# Patient Record
Sex: Female | Born: 1980 | Race: Asian | Hispanic: No | Marital: Married | Smoking: Never smoker
Health system: Southern US, Community
[De-identification: ages and names within clinical notes are randomized; demographics above are authoritative.]

## PROBLEM LIST (undated history)

## (undated) DIAGNOSIS — Z789 Other specified health status: Secondary | ICD-10-CM

## (undated) HISTORY — DX: Other specified health status: Z78.9

## (undated) HISTORY — PX: NO PAST SURGERIES: SHX2092

---

## 2014-08-09 LAB — OB RESULTS CONSOLE ABO/RH: RH Type: POSITIVE

## 2014-08-09 LAB — OB RESULTS CONSOLE GC/CHLAMYDIA
CHLAMYDIA, DNA PROBE: NEGATIVE
GC PROBE AMP, GENITAL: NEGATIVE

## 2014-08-09 LAB — OB RESULTS CONSOLE HEPATITIS B SURFACE ANTIGEN: Hepatitis B Surface Ag: NEGATIVE

## 2014-08-09 LAB — OB RESULTS CONSOLE HIV ANTIBODY (ROUTINE TESTING): HIV: NONREACTIVE

## 2014-08-09 LAB — OB RESULTS CONSOLE RUBELLA ANTIBODY, IGM: RUBELLA: IMMUNE

## 2014-08-09 LAB — OB RESULTS CONSOLE RPR: RPR: NONREACTIVE

## 2014-08-09 LAB — OB RESULTS CONSOLE ANTIBODY SCREEN: Antibody Screen: NEGATIVE

## 2014-12-12 ENCOUNTER — Inpatient Hospital Stay (HOSPITAL_COMMUNITY): Admission: AD | Admit: 2014-12-12 | Payer: Self-pay | Source: Ambulatory Visit | Admitting: Obstetrics and Gynecology

## 2015-03-11 LAB — OB RESULTS CONSOLE GBS: GBS: NEGATIVE

## 2015-03-20 ENCOUNTER — Inpatient Hospital Stay (HOSPITAL_COMMUNITY)
Admission: AD | Admit: 2015-03-20 | Discharge: 2015-03-20 | Disposition: A | Discharge status: Home / Self Care | Payer: BLUE CROSS/BLUE SHIELD | Source: Ambulatory Visit | Attending: Obstetrics and Gynecology | Admitting: Obstetrics and Gynecology

## 2015-03-20 ENCOUNTER — Encounter (HOSPITAL_COMMUNITY): Payer: Self-pay | Admitting: *Deleted

## 2015-03-20 ENCOUNTER — Encounter (HOSPITAL_COMMUNITY): Payer: Self-pay

## 2015-03-20 ENCOUNTER — Telehealth (HOSPITAL_COMMUNITY): Payer: Self-pay | Admitting: *Deleted

## 2015-03-20 NOTE — Discharge Instructions (Signed)
Third Trimester of Pregnancy °The third trimester is from week 29 through week 42, months 7 through 9. The third trimester is a time when the fetus is growing rapidly. At the end of the ninth month, the fetus is about 20 inches in length and weighs 6-10 pounds.  °BODY CHANGES °Your body goes through many changes during pregnancy. The changes vary from woman to woman.  °· Your weight will continue to increase. You can expect to gain 25-35 pounds (11-16 kg) by the end of the pregnancy. °· You may begin to get stretch marks on your hips, abdomen, and breasts. °· You may urinate more often because the fetus is moving lower into your pelvis and pressing on your bladder. °· You may develop or continue to have heartburn as a result of your pregnancy. °· You may develop constipation because certain hormones are causing the muscles that push waste through your intestines to slow down. °· You may develop hemorrhoids or swollen, bulging veins (varicose veins). °· You may have pelvic pain because of the weight gain and pregnancy hormones relaxing your joints between the bones in your pelvis. Backaches may result from overexertion of the muscles supporting your posture. °· You may have changes in your hair. These can include thickening of your hair, rapid growth, and changes in texture. Some women also have hair loss during or after pregnancy, or hair that feels dry or thin. Your hair will most likely return to normal after your baby is born. °· Your breasts will continue to grow and be tender. A yellow discharge may leak from your breasts called colostrum. °· Your belly button may stick out. °· You may feel short of breath because of your expanding uterus. °· You may notice the fetus "dropping," or moving lower in your abdomen. °· You may have a bloody mucus discharge. This usually occurs a few days to a week before labor begins. °· Your cervix becomes thin and soft (effaced) near your due date. °WHAT TO EXPECT AT YOUR PRENATAL  EXAMS  °You will have prenatal exams every 2 weeks until week 36. Then, you will have weekly prenatal exams. During a routine prenatal visit: °· You will be weighed to make sure you and the fetus are growing normally. °· Your blood pressure is taken. °· Your abdomen will be measured to track your baby's growth. °· The fetal heartbeat will be listened to. °· Any test results from the previous visit will be discussed. °· You may have a cervical check near your due date to see if you have effaced. °At around 36 weeks, your caregiver will check your cervix. At the same time, your caregiver will also perform a test on the secretions of the vaginal tissue. This test is to determine if a type of bacteria, Group B streptococcus, is present. Your caregiver will explain this further. °Your caregiver may ask you: °· What your birth plan is. °· How you are feeling. °· If you are feeling the baby move. °· If you have had any abnormal symptoms, such as leaking fluid, bleeding, severe headaches, or abdominal cramping. °· If you are using any tobacco products, including cigarettes, chewing tobacco, and electronic cigarettes. °· If you have any questions. °Other tests or screenings that may be performed during your third trimester include: °· Blood tests that check for low iron levels (anemia). °· Fetal testing to check the health, activity level, and growth of the fetus. Testing is done if you have certain medical conditions or if   there are problems during the pregnancy. °· HIV (human immunodeficiency virus) testing. If you are at high risk, you may be screened for HIV during your third trimester of pregnancy. °FALSE LABOR °You may feel small, irregular contractions that eventually go away. These are called Braxton Hicks contractions, or false labor. Contractions may last for hours, days, or even weeks before true labor sets in. If contractions come at regular intervals, intensify, or become painful, it is best to be seen by your  caregiver.  °SIGNS OF LABOR  °· Menstrual-like cramps. °· Contractions that are 5 minutes apart or less. °· Contractions that start on the top of the uterus and spread down to the lower abdomen and back. °· A sense of increased pelvic pressure or back pain. °· A watery or bloody mucus discharge that comes from the vagina. °If you have any of these signs before the 37th week of pregnancy, call your caregiver right away. You need to go to the hospital to get checked immediately. °HOME CARE INSTRUCTIONS  °· Avoid all smoking, herbs, alcohol, and unprescribed drugs. These chemicals affect the formation and growth of the baby. °· Do not use any tobacco products, including cigarettes, chewing tobacco, and electronic cigarettes. If you need help quitting, ask your health care provider. You may receive counseling support and other resources to help you quit. °· Follow your caregiver's instructions regarding medicine use. There are medicines that are either safe or unsafe to take during pregnancy. °· Exercise only as directed by your caregiver. Experiencing uterine cramps is a good sign to stop exercising. °· Continue to eat regular, healthy meals. °· Wear a good support bra for breast tenderness. °· Do not use hot tubs, steam rooms, or saunas. °· Wear your seat belt at all times when driving. °· Avoid raw meat, uncooked cheese, cat litter boxes, and soil used by cats. These carry germs that can cause birth defects in the baby. °· Take your prenatal vitamins. °· Take 1500-2000 mg of calcium daily starting at the 20th week of pregnancy until you deliver your baby. °· Try taking a stool softener (if your caregiver approves) if you develop constipation. Eat more high-fiber foods, such as fresh vegetables or fruit and whole grains. Drink plenty of fluids to keep your urine clear or pale yellow. °· Take warm sitz baths to soothe any pain or discomfort caused by hemorrhoids. Use hemorrhoid cream if your caregiver approves. °· If  you develop varicose veins, wear support hose. Elevate your feet for 15 minutes, 3-4 times a day. Limit salt in your diet. °· Avoid heavy lifting, wear low heal shoes, and practice good posture. °· Rest a lot with your legs elevated if you have leg cramps or low back pain. °· Visit your dentist if you have not gone during your pregnancy. Use a soft toothbrush to brush your teeth and be gentle when you floss. °· A sexual relationship may be continued unless your caregiver directs you otherwise. °· Do not travel far distances unless it is absolutely necessary and only with the approval of your caregiver. °· Take prenatal classes to understand, practice, and ask questions about the labor and delivery. °· Make a trial run to the hospital. °· Pack your hospital bag. °· Prepare the baby's nursery. °· Continue to go to all your prenatal visits as directed by your caregiver. °SEEK MEDICAL CARE IF: °· You are unsure if you are in labor or if your water has broken. °· You have dizziness. °· You have   mild pelvic cramps, pelvic pressure, or nagging pain in your abdominal area.  You have persistent nausea, vomiting, or diarrhea.  You have a bad smelling vaginal discharge.  You have pain with urination. SEEK IMMEDIATE MEDICAL CARE IF:   You have a fever.  You are leaking fluid from your vagina.  You have spotting or bleeding from your vagina.  You have severe abdominal cramping or pain.  You have rapid weight loss or gain.  You have shortness of breath with chest pain.  You notice sudden or extreme swelling of your face, hands, ankles, feet, or legs.  You have not felt your baby move in over an hour.  You have severe headaches that do not go away with medicine.  You have vision changes.   This information is not intended to replace advice given to you by your health care provider. Make sure you discuss any questions you have with your health care provider.   Document Released: 04/08/2001 Document  Revised: 05/05/2014 Document Reviewed: 06/15/2012 Elsevier Interactive Patient Education 2016 [place]. Fetal Movement Counts Patient Name: __________________________________________________ Patient Due Date: ____________________ Performing a fetal movement count is highly recommended in high-risk pregnancies, but it is good for every pregnant woman to do. Your health care provider may ask you to start counting fetal movements at 28 weeks of the pregnancy. Fetal movements often increase:  After eating a full meal.  After physical activity.  After eating or drinking something sweet or cold.  At rest. Pay attention to when you feel the baby is most active. This will help you notice a pattern of your baby's sleep and wake cycles and what factors contribute to an increase in fetal movement. It is important to perform a fetal movement count at the same time each day when your baby is normally most active.  HOW TO COUNT FETAL MOVEMENTS  Find a quiet and comfortable area to sit or lie down on your left side. Lying on your left side provides the best blood and oxygen circulation to your baby.  Write down the day and time on a sheet of paper or in a journal.  Start counting kicks, flutters, swishes, rolls, or jabs in a 2-hour period. You should feel at least 10 movements within 2 hours.  If you do not feel 10 movements in 2 hours, wait 2-3 hours and count again. Look for a change in the pattern or not enough counts in 2 hours. SEEK MEDICAL CARE IF:  You feel less than 10 counts in 2 hours, tried twice.  There is no movement in over an hour.  The pattern is changing or taking longer each day to reach 10 counts in 2 hours.  You feel the baby is not moving as he or she usually does. Date: ____________ Movements: ____________ Start time: ____________ Elizebeth Koller time: ____________  Date: ____________ Movements: ____________ Start time: ____________ Elizebeth Koller time: ____________ Date: ____________  Movements: ____________ Start time: ____________ Elizebeth Koller time: ____________ Date: ____________ Movements: ____________ Start time: ____________ Elizebeth Koller time: ____________ Date: ____________ Movements: ____________ Start time: ____________ Elizebeth Koller time: ____________ Date: ____________ Movements: ____________ Start time: ____________ Elizebeth Koller time: ____________ Date: ____________ Movements: ____________ Start time: ____________ Elizebeth Koller time: ____________ Date: ____________ Movements: ____________ Start time: ____________ Elizebeth Koller time: ____________  Date: ____________ Movements: ____________ Start time: ____________ Elizebeth Koller time: ____________ Date: ____________ Movements: ____________ Start time: ____________ Elizebeth Koller time: ____________ Date: ____________ Movements: ____________ Start time: ____________ Elizebeth Koller time: ____________ Date: ____________ Movements: ____________ Start time: ____________ Elizebeth Koller time: ____________ Date:  ____________ Movements: ____________ Start time: ____________ Elizebeth Koller time: ____________ Date: ____________ Movements: ____________ Start time: ____________ Elizebeth Koller time: ____________ Date: ____________ Movements: ____________ Start time: ____________ Elizebeth Koller time: ____________  Date: ____________ Movements: ____________ Start time: ____________ Elizebeth Koller time: ____________ Date: ____________ Movements: ____________ Start time: ____________ Elizebeth Koller time: ____________ Date: ____________ Movements: ____________ Start time: ____________ Elizebeth Koller time: ____________ Date: ____________ Movements: ____________ Start time: ____________ Elizebeth Koller time: ____________ Date: ____________ Movements: ____________ Start time: ____________ Elizebeth Koller time: ____________ Date: ____________ Movements: ____________ Start time: ____________ Elizebeth Koller time: ____________ Date: ____________ Movements: ____________ Start time: ____________ Elizebeth Koller time: ____________  Date: ____________ Movements: ____________ Start time:  ____________ Elizebeth Koller time: ____________ Date: ____________ Movements: ____________ Start time: ____________ Elizebeth Koller time: ____________ Date: ____________ Movements: ____________ Start time: ____________ Elizebeth Koller time: ____________ Date: ____________ Movements: ____________ Start time: ____________ Elizebeth Koller time: ____________ Date: ____________ Movements: ____________ Start time: ____________ Elizebeth Koller time: ____________ Date: ____________ Movements: ____________ Start time: ____________ Elizebeth Koller time: ____________ Date: ____________ Movements: ____________ Start time: ____________ Elizebeth Koller time: ____________  Date: ____________ Movements: ____________ Start time: ____________ Elizebeth Koller time: ____________ Date: ____________ Movements: ____________ Start time: ____________ Elizebeth Koller time: ____________ Date: ____________ Movements: ____________ Start time: ____________ Elizebeth Koller time: ____________ Date: ____________ Movements: ____________ Start time: ____________ Elizebeth Koller time: ____________ Date: ____________ Movements: ____________ Start time: ____________ Elizebeth Koller time: ____________ Date: ____________ Movements: ____________ Start time: ____________ Elizebeth Koller time: ____________ Date: ____________ Movements: ____________ Start time: ____________ Elizebeth Koller time: ____________  Date: ____________ Movements: ____________ Start time: ____________ Elizebeth Koller time: ____________ Date: ____________ Movements: ____________ Start time: ____________ Elizebeth Koller time: ____________ Date: ____________ Movements: ____________ Start time: ____________ Elizebeth Koller time: ____________ Date: ____________ Movements: ____________ Start time: ____________ Elizebeth Koller time: ____________ Date: ____________ Movements: ____________ Start time: ____________ Elizebeth Koller time: ____________ Date: ____________ Movements: ____________ Start time: ____________ Elizebeth Koller time: ____________ Date: ____________ Movements: ____________ Start time: ____________ Elizebeth Koller time: ____________  Date:  ____________ Movements: ____________ Start time: ____________ Elizebeth Koller time: ____________ Date: ____________ Movements: ____________ Start time: ____________ Elizebeth Koller time: ____________ Date: ____________ Movements: ____________ Start time: ____________ Elizebeth Koller time: ____________ Date: ____________ Movements: ____________ Start time: ____________ Elizebeth Koller time: ____________ Date: ____________ Movements: ____________ Start time: ____________ Elizebeth Koller time: ____________ Date: ____________ Movements: ____________ Start time: ____________ Elizebeth Koller time: ____________ Date: ____________ Movements: ____________ Start time: ____________ Elizebeth Koller time: ____________  Date: ____________ Movements: ____________ Start time: ____________ Elizebeth Koller time: ____________ Date: ____________ Movements: ____________ Start time: ____________ Elizebeth Koller time: ____________ Date: ____________ Movements: ____________ Start time: ____________ Elizebeth Koller time: ____________ Date: ____________ Movements: ____________ Start time: ____________ Elizebeth Koller time: ____________ Date: ____________ Movements: ____________ Start time: ____________ Elizebeth Koller time: ____________ Date: ____________ Movements: ____________ Start time: ____________ Elizebeth Koller time: ____________   This information is not intended to replace advice given to you by your health care provider. Make sure you discuss any questions you have with your health care provider.   Document Released: 05/14/2006 Document Revised: 05/05/2014 Document Reviewed: 02/09/2012 Elsevier Interactive Patient Education 2016 [employer] of the uterus can occur throughout pregnancy. Contractions are not always a sign that you are in labor.  WHAT ARE BRAXTON HICKS CONTRACTIONS?  Contractions that occur before labor are called Braxton Hicks contractions, or false labor. Toward the end of pregnancy (32-34 weeks), these contractions can develop more often and may become more  forceful. This is not true labor because these contractions do not result in opening (dilatation) and thinning of the cervix. They are sometimes difficult to tell apart from true labor because these contractions can be forceful and people have different pain tolerances. You should  not feel embarrassed if you go to the hospital with false labor. Sometimes, the only way to tell if you are in true labor is for your health care provider to look for changes in the cervix. If there are no prenatal problems or other health problems associated with the pregnancy, it is completely safe to be sent home with false labor and await the onset of true labor. HOW CAN YOU TELL THE DIFFERENCE BETWEEN TRUE AND FALSE LABOR? False Labor  The contractions of false labor are usually shorter and not as hard as those of true labor.   The contractions are usually irregular.   The contractions are often felt in the front of the lower abdomen and in the groin.   The contractions may go away when you walk around or change positions while lying down.   The contractions get weaker and are shorter lasting as time goes on.   The contractions do not usually become progressively stronger, regular, and closer together as with true labor.  True Labor  Contractions in true labor last 30-70 seconds, become very regular, usually become more intense, and increase in frequency.   The contractions do not go away with walking.   The discomfort is usually felt in the top of the uterus and spreads to the lower abdomen and low back.   True labor can be determined by your health care provider with an exam. This will show that the cervix is dilating and getting thinner.  WHAT TO REMEMBER  Keep up with your usual exercises and follow other instructions given by your health care provider.   Take medicines as directed by your health care provider.   Keep your regular prenatal appointments.   Eat and drink lightly if you  think you are going into labor.   If Braxton Hicks contractions are making you uncomfortable:   Change your position from lying down or resting to walking, or from walking to resting.   Sit and rest in a tub of warm water.   Drink 2-3 glasses of water. Dehydration may cause these contractions.   Do slow and deep breathing several times an hour.  WHEN SHOULD I SEEK IMMEDIATE MEDICAL CARE? Seek immediate medical care if:  Your contractions become stronger, more regular, and closer together.   You have fluid leaking or gushing from your vagina.   You have a fever.   You pass blood-tinged mucus.   You have vaginal bleeding.   You have continuous abdominal pain.   You have low back pain that you never had before.   You feel your baby's head pushing down and causing pelvic pressure.   Your baby is not moving as much as it used to.    This information is not intended to replace advice given to you by your health care provider. Make sure you discuss any questions you have with your health care provider.   Document Released: 04/14/2005 Document Revised: 04/19/2013 Document Reviewed: 01/24/2013 Elsevier Interactive Patient Education [employer].

## 2015-03-20 NOTE — Telephone Encounter (Signed)
Preadmission screen  

## 2015-03-20 NOTE — Progress Notes (Signed)
Called to notify of pt arrival in MAU, cervical exam, and FHR tracing. Will discharge pt home

## 2015-03-20 NOTE — MAU Note (Signed)
Pt reports contractions for the last 3 hours, denies bleeding or ROM.

## 2015-03-22 ENCOUNTER — Encounter (HOSPITAL_COMMUNITY)
Admission: AD | Disposition: A | Discharge status: Home / Self Care | Payer: Self-pay | Source: Ambulatory Visit | Attending: Obstetrics and Gynecology

## 2015-03-22 ENCOUNTER — Inpatient Hospital Stay (HOSPITAL_COMMUNITY)
Admission: AD | Admit: 2015-03-22 | Discharge: 2015-03-25 | DRG: 766 | Disposition: A | Discharge status: Home / Self Care | Payer: BLUE CROSS/BLUE SHIELD | Source: Ambulatory Visit | Attending: Obstetrics and Gynecology | Admitting: Obstetrics and Gynecology

## 2015-03-22 ENCOUNTER — Encounter (HOSPITAL_COMMUNITY): Payer: Self-pay | Admitting: *Deleted

## 2015-03-22 ENCOUNTER — Inpatient Hospital Stay (HOSPITAL_COMMUNITY): Payer: BLUE CROSS/BLUE SHIELD | Admitting: Anesthesiology

## 2015-03-22 DIAGNOSIS — D252 Subserosal leiomyoma of uterus: Secondary | ICD-10-CM | POA: Diagnosis present

## 2015-03-22 DIAGNOSIS — Z8249 Family history of ischemic heart disease and other diseases of the circulatory system: Secondary | ICD-10-CM

## 2015-03-22 DIAGNOSIS — Z809 Family history of malignant neoplasm, unspecified: Secondary | ICD-10-CM | POA: Diagnosis not present

## 2015-03-22 DIAGNOSIS — O324XX0 Maternal care for high head at term, not applicable or unspecified: Secondary | ICD-10-CM | POA: Diagnosis present

## 2015-03-22 DIAGNOSIS — IMO0001 Reserved for inherently not codable concepts without codable children: Secondary | ICD-10-CM

## 2015-03-22 DIAGNOSIS — Z833 Family history of diabetes mellitus: Secondary | ICD-10-CM | POA: Diagnosis not present

## 2015-03-22 DIAGNOSIS — Z98891 History of uterine scar from previous surgery: Secondary | ICD-10-CM

## 2015-03-22 DIAGNOSIS — Z3A40 40 weeks gestation of pregnancy: Secondary | ICD-10-CM

## 2015-03-22 DIAGNOSIS — O480 Post-term pregnancy: Secondary | ICD-10-CM | POA: Diagnosis present

## 2015-03-22 LAB — CBC
HCT: 36.1 % (ref 36.0–46.0)
HEMOGLOBIN: 11.7 g/dL — AB (ref 12.0–15.0)
MCH: 28 pg (ref 26.0–34.0)
MCHC: 32.4 g/dL (ref 30.0–36.0)
MCV: 86.4 fL (ref 78.0–100.0)
Platelets: 228 10*3/uL (ref 150–400)
RBC: 4.18 MIL/uL (ref 3.87–5.11)
RDW: 17.7 % — ABNORMAL HIGH (ref 11.5–15.5)
WBC: 10.2 10*3/uL (ref 4.0–10.5)

## 2015-03-22 LAB — TYPE AND SCREEN
ABO/RH(D): B POS
ANTIBODY SCREEN: NEGATIVE

## 2015-03-22 LAB — ABO/RH: ABO/RH(D): B POS

## 2015-03-22 SURGERY — Surgical Case
Anesthesia: Epidural

## 2015-03-22 MED ORDER — OXYTOCIN 10 UNIT/ML IJ SOLN
40.0000 [IU] | INTRAVENOUS | Status: DC | PRN
  Administered 2015-03-22: 40 [IU] via INTRAVENOUS

## 2015-03-22 MED ORDER — MEPERIDINE HCL 25 MG/ML IJ SOLN
INTRAMUSCULAR | Status: DC | PRN
  Administered 2015-03-22: 25 mg via INTRAVENOUS

## 2015-03-22 MED ORDER — SCOPOLAMINE 1 MG/3DAYS TD PT72: 1.0000 | MEDICATED_PATCH | Freq: Once | TRANSDERMAL | Status: DC

## 2015-03-22 MED ORDER — MEPERIDINE HCL 25 MG/ML IJ SOLN: 6.2500 mg | INTRAMUSCULAR | Status: DC | PRN

## 2015-03-22 MED ORDER — NALOXONE HCL 0.4 MG/ML IJ SOLN: 0.4000 mg | INTRAMUSCULAR | Status: DC | PRN

## 2015-03-22 MED ORDER — OXYTOCIN 40 UNITS IN LACTATED RINGERS INFUSION - SIMPLE MED: 62.5000 mL/h | INTRAVENOUS | Status: DC

## 2015-03-22 MED ORDER — CITRIC ACID-SODIUM CITRATE 334-500 MG/5ML PO SOLN
30.0000 mL | ORAL | Status: DC | PRN
  Administered 2015-03-22: 30 mL via ORAL
  Filled 2015-03-22: qty 15

## 2015-03-22 MED ORDER — NALBUPHINE HCL 10 MG/ML IJ SOLN
5.0000 mg | Freq: Once | INTRAMUSCULAR | Status: AC | PRN
  Administered 2015-03-23: 05:00:00 via INTRAVENOUS

## 2015-03-22 MED ORDER — GENTAMICIN SULFATE 40 MG/ML IJ SOLN
INTRAVENOUS | Status: AC
  Administered 2015-03-22: 116 mL via INTRAVENOUS
  Filled 2015-03-22: qty 10

## 2015-03-22 MED ORDER — PROMETHAZINE HCL 25 MG/ML IJ SOLN
6.2500 mg | INTRAMUSCULAR | Status: DC | PRN
Start: 2015-03-22 — End: 2015-03-22

## 2015-03-22 MED ORDER — LANOLIN HYDROUS EX OINT: 1.0000 "application " | TOPICAL_OINTMENT | CUTANEOUS | Status: DC | PRN

## 2015-03-22 MED ORDER — NALBUPHINE HCL 10 MG/ML IJ SOLN: 5.0000 mg | Freq: Once | INTRAMUSCULAR | Status: AC | PRN

## 2015-03-22 MED ORDER — IBUPROFEN 600 MG PO TABS
600.0000 mg | ORAL_TABLET | Freq: Four times a day (QID) | ORAL | Status: DC
  Administered 2015-03-23 – 2015-03-25 (×9): 600 mg via ORAL
  Filled 2015-03-22 (×9): qty 1

## 2015-03-22 MED ORDER — TERBUTALINE SULFATE 1 MG/ML IJ SOLN: 0.2500 mg | Freq: Once | INTRAMUSCULAR | Status: DC | PRN

## 2015-03-22 MED ORDER — ZOLPIDEM TARTRATE 5 MG PO TABS: 5.0000 mg | ORAL_TABLET | Freq: Every evening | ORAL | Status: DC | PRN

## 2015-03-22 MED ORDER — ONDANSETRON HCL 4 MG/2ML IJ SOLN: 4.0000 mg | Freq: Three times a day (TID) | INTRAMUSCULAR | Status: DC | PRN

## 2015-03-22 MED ORDER — BUTORPHANOL TARTRATE 1 MG/ML IJ SOLN: 1.0000 mg | INTRAMUSCULAR | Status: DC | PRN

## 2015-03-22 MED ORDER — NALBUPHINE HCL 10 MG/ML IJ SOLN
5.0000 mg | INTRAMUSCULAR | Status: DC | PRN
  Administered 2015-03-23: 5 mg via INTRAVENOUS
  Filled 2015-03-22: qty 1

## 2015-03-22 MED ORDER — ONDANSETRON HCL 4 MG/2ML IJ SOLN
INTRAMUSCULAR | Status: AC
  Filled 2015-03-22: qty 2

## 2015-03-22 MED ORDER — MENTHOL 3 MG MT LOZG: 1.0000 | LOZENGE | OROMUCOSAL | Status: DC | PRN

## 2015-03-22 MED ORDER — KETOROLAC TROMETHAMINE 30 MG/ML IJ SOLN
30.0000 mg | Freq: Four times a day (QID) | INTRAMUSCULAR | Status: AC | PRN
  Administered 2015-03-22 – 2015-03-23 (×2): 30 mg via INTRAMUSCULAR

## 2015-03-22 MED ORDER — FENTANYL 2.5 MCG/ML BUPIVACAINE 1/10 % EPIDURAL INFUSION (WH - ANES)
14.0000 mL/h | INTRAMUSCULAR | Status: DC | PRN
  Administered 2015-03-22 (×2): 14 mL/h via EPIDURAL
  Filled 2015-03-22: qty 125

## 2015-03-22 MED ORDER — LIDOCAINE-EPINEPHRINE (PF) 2 %-1:200000 IJ SOLN
INTRAMUSCULAR | Status: AC
  Filled 2015-03-22: qty 20

## 2015-03-22 MED ORDER — SODIUM BICARBONATE 8.4 % IV SOLN
INTRAVENOUS | Status: DC | PRN
  Administered 2015-03-22 (×2): 5 mL via EPIDURAL

## 2015-03-22 MED ORDER — DEXTROSE 5 % IV SOLN
1.0000 ug/kg/h | INTRAVENOUS | Status: DC | PRN
  Filled 2015-03-22: qty 2

## 2015-03-22 MED ORDER — LACTATED RINGERS IV SOLN
INTRAVENOUS | Status: DC | PRN
  Administered 2015-03-22 (×2): via INTRAVENOUS

## 2015-03-22 MED ORDER — SENNOSIDES-DOCUSATE SODIUM 8.6-50 MG PO TABS
2.0000 | ORAL_TABLET | ORAL | Status: DC
  Administered 2015-03-23 – 2015-03-24 (×2): 2 via ORAL
  Filled 2015-03-22 (×2): qty 2

## 2015-03-22 MED ORDER — MORPHINE SULFATE (PF) 0.5 MG/ML IJ SOLN
INTRAMUSCULAR | Status: AC
  Filled 2015-03-22: qty 10

## 2015-03-22 MED ORDER — ONDANSETRON HCL 4 MG/2ML IJ SOLN
INTRAMUSCULAR | Status: DC | PRN
  Administered 2015-03-22: 4 mg via INTRAVENOUS

## 2015-03-22 MED ORDER — OXYCODONE-ACETAMINOPHEN 5-325 MG PO TABS
2.0000 | ORAL_TABLET | ORAL | Status: DC | PRN
  Administered 2015-03-23 – 2015-03-24 (×4): 2 via ORAL
  Filled 2015-03-22 (×5): qty 2

## 2015-03-22 MED ORDER — MEPERIDINE HCL 25 MG/ML IJ SOLN
INTRAMUSCULAR | Status: AC
  Filled 2015-03-22: qty 1

## 2015-03-22 MED ORDER — LACTATED RINGERS IV SOLN
500.0000 mL | INTRAVENOUS | Status: DC | PRN
  Administered 2015-03-22 (×2): 500 mL via INTRAVENOUS

## 2015-03-22 MED ORDER — LACTATED RINGERS IV SOLN
INTRAVENOUS | Status: DC
  Administered 2015-03-23: 03:00:00 via INTRAVENOUS

## 2015-03-22 MED ORDER — OXYTOCIN 10 UNIT/ML IJ SOLN
INTRAMUSCULAR | Status: AC
  Filled 2015-03-22: qty 4

## 2015-03-22 MED ORDER — DIPHENHYDRAMINE HCL 25 MG PO CAPS: 25.0000 mg | ORAL_CAPSULE | Freq: Four times a day (QID) | ORAL | Status: DC | PRN

## 2015-03-22 MED ORDER — OXYCODONE-ACETAMINOPHEN 5-325 MG PO TABS
1.0000 | ORAL_TABLET | ORAL | Status: DC | PRN
  Administered 2015-03-24 (×2): 1 via ORAL
  Filled 2015-03-22 (×2): qty 1

## 2015-03-22 MED ORDER — LACTATED RINGERS IV SOLN
INTRAVENOUS | Status: DC
  Administered 2015-03-22 (×3): via INTRAVENOUS

## 2015-03-22 MED ORDER — KETOROLAC TROMETHAMINE 30 MG/ML IJ SOLN
30.0000 mg | Freq: Four times a day (QID) | INTRAMUSCULAR | Status: AC | PRN
  Filled 2015-03-22: qty 1

## 2015-03-22 MED ORDER — WITCH HAZEL-GLYCERIN EX PADS: 1.0000 "application " | MEDICATED_PAD | CUTANEOUS | Status: DC | PRN

## 2015-03-22 MED ORDER — LIDOCAINE HCL (PF) 1 % IJ SOLN
INTRAMUSCULAR | Status: DC | PRN
  Administered 2015-03-22 (×2): 4 mL via EPIDURAL

## 2015-03-22 MED ORDER — PRENATAL MULTIVITAMIN CH
1.0000 | ORAL_TABLET | Freq: Every day | ORAL | Status: DC
Start: 2015-03-23 — End: 2015-03-25
  Administered 2015-03-23 – 2015-03-25 (×3): 1 via ORAL
  Filled 2015-03-22 (×3): qty 1

## 2015-03-22 MED ORDER — NALBUPHINE HCL 10 MG/ML IJ SOLN: 5.0000 mg | INTRAMUSCULAR | Status: DC | PRN

## 2015-03-22 MED ORDER — DIBUCAINE 1 % RE OINT: 1.0000 "application " | TOPICAL_OINTMENT | RECTAL | Status: DC | PRN

## 2015-03-22 MED ORDER — OXYTOCIN 40 UNITS IN LACTATED RINGERS INFUSION - SIMPLE MED
1.0000 m[IU]/min | INTRAVENOUS | Status: DC
  Administered 2015-03-22: 2 m[IU]/min via INTRAVENOUS
  Filled 2015-03-22: qty 1000

## 2015-03-22 MED ORDER — KETOROLAC TROMETHAMINE 30 MG/ML IJ SOLN
INTRAMUSCULAR | Status: AC
  Administered 2015-03-22: 30 mg via INTRAMUSCULAR
  Filled 2015-03-22: qty 1

## 2015-03-22 MED ORDER — ACETAMINOPHEN 325 MG PO TABS
650.0000 mg | ORAL_TABLET | ORAL | Status: DC | PRN
  Administered 2015-03-23: 650 mg via ORAL
  Filled 2015-03-22: qty 2

## 2015-03-22 MED ORDER — SODIUM CHLORIDE 0.9 % IJ SOLN: 3.0000 mL | INTRAMUSCULAR | Status: DC | PRN

## 2015-03-22 MED ORDER — PHENYLEPHRINE 40 MCG/ML (10ML) SYRINGE FOR IV PUSH (FOR BLOOD PRESSURE SUPPORT)
80.0000 ug | PREFILLED_SYRINGE | INTRAVENOUS | Status: DC | PRN
  Filled 2015-03-22: qty 20

## 2015-03-22 MED ORDER — ACETAMINOPHEN 325 MG PO TABS: 650.0000 mg | ORAL_TABLET | ORAL | Status: DC | PRN

## 2015-03-22 MED ORDER — OXYTOCIN BOLUS FROM INFUSION: 500.0000 mL | INTRAVENOUS | Status: DC

## 2015-03-22 MED ORDER — OXYTOCIN 40 UNITS IN LACTATED RINGERS INFUSION - SIMPLE MED: 62.5000 mL/h | INTRAVENOUS | Status: AC

## 2015-03-22 MED ORDER — DIPHENHYDRAMINE HCL 50 MG/ML IJ SOLN: 12.5000 mg | INTRAMUSCULAR | Status: DC | PRN

## 2015-03-22 MED ORDER — LACTATED RINGERS IV SOLN: INTRAVENOUS | Status: DC

## 2015-03-22 MED ORDER — SIMETHICONE 80 MG PO CHEW
80.0000 mg | CHEWABLE_TABLET | Freq: Three times a day (TID) | ORAL | Status: DC
  Administered 2015-03-23 – 2015-03-25 (×7): 80 mg via ORAL
  Filled 2015-03-22 (×8): qty 1

## 2015-03-22 MED ORDER — IBUPROFEN 600 MG PO TABS: 600.0000 mg | ORAL_TABLET | Freq: Four times a day (QID) | ORAL | Status: DC

## 2015-03-22 MED ORDER — MORPHINE SULFATE (PF) 0.5 MG/ML IJ SOLN
INTRAMUSCULAR | Status: DC | PRN
Start: 2015-03-22 — End: 2015-03-22
  Administered 2015-03-22: 1 mg via INTRAVENOUS
  Administered 2015-03-22: 4 mg via EPIDURAL

## 2015-03-22 MED ORDER — OXYCODONE-ACETAMINOPHEN 5-325 MG PO TABS: 1.0000 | ORAL_TABLET | ORAL | Status: DC | PRN

## 2015-03-22 MED ORDER — SODIUM BICARBONATE 8.4 % IV SOLN
INTRAVENOUS | Status: AC
  Filled 2015-03-22: qty 50

## 2015-03-22 MED ORDER — SODIUM CHLORIDE 0.9 % IR SOLN
Status: DC | PRN
  Administered 2015-03-22: 1000 mL

## 2015-03-22 MED ORDER — OXYCODONE-ACETAMINOPHEN 5-325 MG PO TABS: 2.0000 | ORAL_TABLET | ORAL | Status: DC | PRN

## 2015-03-22 MED ORDER — TETANUS-DIPHTH-ACELL PERTUSSIS 5-2.5-18.5 LF-MCG/0.5 IM SUSP: 0.5000 mL | Freq: Once | INTRAMUSCULAR | Status: DC

## 2015-03-22 MED ORDER — EPHEDRINE 5 MG/ML INJ: 10.0000 mg | INTRAVENOUS | Status: DC | PRN

## 2015-03-22 MED ORDER — FLEET ENEMA 7-19 GM/118ML RE ENEM: 1.0000 | ENEMA | RECTAL | Status: DC | PRN

## 2015-03-22 MED ORDER — DIPHENHYDRAMINE HCL 25 MG PO CAPS
25.0000 mg | ORAL_CAPSULE | ORAL | Status: DC | PRN
  Filled 2015-03-22: qty 1

## 2015-03-22 MED ORDER — SCOPOLAMINE 1 MG/3DAYS TD PT72
MEDICATED_PATCH | TRANSDERMAL | Status: DC | PRN
  Administered 2015-03-22: 1 via TRANSDERMAL

## 2015-03-22 MED ORDER — SIMETHICONE 80 MG PO CHEW: 80.0000 mg | CHEWABLE_TABLET | ORAL | Status: DC | PRN

## 2015-03-22 MED ORDER — LACTATED RINGERS IV SOLN
INTRAVENOUS | Status: DC | PRN
  Administered 2015-03-22: 18:00:00 via INTRAVENOUS

## 2015-03-22 MED ORDER — FENTANYL CITRATE (PF) 100 MCG/2ML IJ SOLN: 25.0000 ug | INTRAMUSCULAR | Status: DC | PRN

## 2015-03-22 MED ORDER — LIDOCAINE HCL (PF) 1 % IJ SOLN: 30.0000 mL | INTRAMUSCULAR | Status: DC | PRN

## 2015-03-22 MED ORDER — SIMETHICONE 80 MG PO CHEW
80.0000 mg | CHEWABLE_TABLET | ORAL | Status: DC
Start: 2015-03-23 — End: 2015-03-25
  Administered 2015-03-23 – 2015-03-24 (×2): 80 mg via ORAL
  Filled 2015-03-22 (×2): qty 1

## 2015-03-22 MED ORDER — SCOPOLAMINE 1 MG/3DAYS TD PT72
MEDICATED_PATCH | TRANSDERMAL | Status: AC
  Filled 2015-03-22: qty 1

## 2015-03-22 MED ORDER — ONDANSETRON HCL 4 MG/2ML IJ SOLN
4.0000 mg | Freq: Four times a day (QID) | INTRAMUSCULAR | Status: DC | PRN
  Administered 2015-03-22: 4 mg via INTRAVENOUS
  Filled 2015-03-22: qty 2

## 2015-03-22 SURGICAL SUPPLY — 36 items
BENZOIN TINCTURE PRP APPL 2/3 (GAUZE/BANDAGES/DRESSINGS) ×3 IMPLANT
CLAMP CORD UMBIL (MISCELLANEOUS) IMPLANT
CLOSURE STERI STRIP 1/2 X4 (GAUZE/BANDAGES/DRESSINGS) ×6 IMPLANT
CLOSURE WOUND 1/2 X4 (GAUZE/BANDAGES/DRESSINGS)
CLOTH BEACON ORANGE TIMEOUT ST (SAFETY) ×3 IMPLANT
CONTAINER PREFILL 10% NBF 15ML (MISCELLANEOUS) IMPLANT
DRAPE SHEET LG 3/4 BI-LAMINATE (DRAPES) IMPLANT
DRSG OPSITE POSTOP 4X10 (GAUZE/BANDAGES/DRESSINGS) ×3 IMPLANT
DRSG OPSITE POSTOP 4X12 (GAUZE/BANDAGES/DRESSINGS) ×3 IMPLANT
DURAPREP 26ML APPLICATOR (WOUND CARE) ×3 IMPLANT
ELECT REM PT RETURN 9FT ADLT (ELECTROSURGICAL) ×3
ELECTRODE REM PT RTRN 9FT ADLT (ELECTROSURGICAL) ×1 IMPLANT
EXTRACTOR VACUUM M CUP 4 TUBE (SUCTIONS) ×2 IMPLANT
EXTRACTOR VACUUM M CUP 4' TUBE (SUCTIONS) ×1
GLOVE BIO SURGEON STRL SZ8 (GLOVE) ×3 IMPLANT
GLOVE BIOGEL PI IND STRL 7.0 (GLOVE) ×1 IMPLANT
GLOVE BIOGEL PI INDICATOR 7.0 (GLOVE) ×2
GOWN STRL REUS W/TWL LRG LVL3 (GOWN DISPOSABLE) ×6 IMPLANT
KIT ABG SYR 3ML LUER SLIP (SYRINGE) ×3 IMPLANT
LIQUID BAND (GAUZE/BANDAGES/DRESSINGS) IMPLANT
NEEDLE HYPO 25X5/8 SAFETYGLIDE (NEEDLE) ×3 IMPLANT
NS IRRIG 1000ML POUR BTL (IV SOLUTION) ×3 IMPLANT
PACK C SECTION WH (CUSTOM PROCEDURE TRAY) ×3 IMPLANT
PAD OB MATERNITY 4.3X12.25 (PERSONAL CARE ITEMS) ×3 IMPLANT
PENCIL SMOKE EVAC W/HOLSTER (ELECTROSURGICAL) ×3 IMPLANT
STRIP CLOSURE SKIN 1/2X4 (GAUZE/BANDAGES/DRESSINGS) IMPLANT
SUT MNCRL 0 VIOLET CTX 36 (SUTURE) ×4 IMPLANT
SUT MONOCRYL 0 CTX 36 (SUTURE) ×8
SUT PDS AB 0 CTX 60 (SUTURE) ×3 IMPLANT
SUT PLAIN 0 NONE (SUTURE) IMPLANT
SUT PLAIN 2 0 (SUTURE)
SUT PLAIN 2 0 XLH (SUTURE) ×3 IMPLANT
SUT PLAIN ABS 2-0 CT1 27XMFL (SUTURE) IMPLANT
SUT VIC AB 4-0 KS 27 (SUTURE) ×3 IMPLANT
TOWEL OR 17X24 6PK STRL BLUE (TOWEL DISPOSABLE) ×3 IMPLANT
TRAY FOLEY CATH SILVER 14FR (SET/KITS/TRAYS/PACK) ×3 IMPLANT

## 2015-03-22 NOTE — Consult Note (Signed)
[facility]  Delivery Note:  C-section       03/22/2015  6:04 PM  I was called to the operating room at the request of the patient's obstetrician (Dr. Gaetano Net) for a primary c-section for failure to descend.  PRENATAL HX:  34 y/o G1P0 at 57 and 6/[redacted] weeks gestation admitted this morning in active labor.  Her pregnancy was uncomplicated by c-section indicated for failure to descent.  ROM was at 0953 today (~8.5 hours)  DELIVERY:  Infant was vigorous at delivery, requiring no resuscitation other than standard warming, drying and stimulation.  APGARs 8 and 9.  Exam within normal limits.  After 5 minutes, baby left with nurse to assist parents with skin-to-skin care.   _____________________ Electronically Signed By: Clinton Gallant, MD Neonatologist

## 2015-03-22 NOTE — Anesthesia Postprocedure Evaluation (Signed)
Anesthesia Post Note  Patient: Ellen Davis  Procedure(s) Performed: Procedure(s) (LRB): CESAREAN SECTION (N/A)  Patient location during evaluation: PACU Anesthesia Type: Epidural Level of consciousness: awake and alert Pain management: pain level controlled Vital Signs Assessment: post-procedure vital signs reviewed and stable Respiratory status: spontaneous breathing, nonlabored ventilation and respiratory function stable Cardiovascular status: stable Postop Assessment: No headache and No backache Anesthetic complications: no    Last Vitals:  Filed Vitals:   03/22/15 1915 03/22/15 1930  BP: 104/70   Pulse: 88   Temp:  37.1 C  Resp: 20     Last Pain:  Filed Vitals:   03/22/15 1940  PainSc: 8     LLE Motor Response: Purposeful movement LLE Sensation: Tingling RLE Motor Response: Purposeful movement RLE Sensation: Tingling      Effie Berkshire

## 2015-03-22 NOTE — Transfer of Care (Signed)
Immediate Anesthesia Transfer of Care Note  Patient: Ellen Davis  Procedure(s) Performed: Procedure(s): CESAREAN SECTION (N/A)  Patient Location: PACU  Anesthesia Type:Epidural  Level of Consciousness: awake  Airway & Oxygen Therapy: Patient Spontanous Breathing  Post-op Assessment: Report given to RN and Post -op Vital signs reviewed and stable  Post vital signs: stable  Last Vitals:  Filed Vitals:   03/22/15 1904 03/22/15 1915  BP:    Pulse:  88  Temp:    Resp: 20 20    Complications: No apparent anesthesia complications

## 2015-03-22 NOTE — Progress Notes (Signed)
4/C/-2/vtx AROM clear FHT reactive UCs q3-5 min Epidural in D/W patient pitocin if needed

## 2015-03-22 NOTE — Op Note (Signed)
NAMEROSEA, SCHEURER                    ACCOUNT NO.:  [id number]  MEDICAL RECORD NO.:  [id number]  LOCATION:  [facility]                          FACILITY:  [facility]  PHYSICIAN:  Daleen Bo. Gaetano Net, M.D. DATE OF BIRTH:  April 03, 1981  DATE OF PROCEDURE:  03/22/2015 DATE OF DISCHARGE:                              OPERATIVE REPORT   PREOPERATIVE DIAGNOSIS:  Arrest of descent.  POSTOPERATIVE DIAGNOSIS:  Arrest of descent.  PROCEDURE:  Low-transverse cesarean section.  SURGEON:  Daleen Bo. Gaetano Net, M.D.  ANESTHESIA:  Epidural.  ESTIMATED BLOOD LOSS:  700 mL.  SPECIMENS:  Placenta to Pathology.  FINDINGS:  Viable female infant, Apgars, arterial cord pH, and birth weight pending.  INDICATIONS AND CONSENT:  This patient is a 34 year old, G1, P0, at 63- 6/7 weeks who presents in active labor.  She progresses to complete on pushing.  After about 2 hours with good effort, the baby has not descended below +1 when she started the second stage.  There was caput formation noted.  In addition, the baby began having late decelerations. Recommendation for cesarean section was made.  Procedure was reviewed with the patient and her husband.  Potential risks and complications were reviewed preoperatively including, but not limited to, infection, organ damage, bleeding requiring transfusion of blood products with HIV and hepatitis acquisition, DVT, PE, and pneumonia.  All questions were answered and consent was signed on the chart.  The patient and husband state they understand.  DESCRIPTION OF PROCEDURE:  The patient was taken to the operating room where she is identified.  Epidural anesthetic was augmented to a surgical level, and she was placed in a dorsal supine position with a 15- degree left lateral wedge.  She was then prepped and draped per [facility] protocol.  Foley catheter was already in place.  Timeout undertaken.  After testing for adequate epidural anesthesia, skin was entered through a  Pfannenstiel incision and dissection was carried out in layers to the peritoneum.  Peritoneum was incised and extended superiorly and inferiorly.  Vesicouterine peritoneum was taken down cephalad laterally.  Bladder flap was developed, and a bladder blade was placed.  Uterus was incised in a low transverse manner, and the uterine cavity was entered bluntly with the heel of the scalpel handle.  The incision was then extended cephalad laterally with the fingers.  Baby was then delivered from the direct OP position.  Vacuum extractor was used to gently deliver the head through the incision.  No pop offs. Remainder of the baby was then delivered without difficulty.  Good cry and tone was noted.  Cord was clamped and cut.  The baby was handed to awaiting Pediatrics team.  Placenta was manually delivered.  Uterine cavity was clean.  Both sides of the uterine incision had extended about 2-3 cm.  These were carefully closed with 2 running locking imbricating layers of 0 Monocryl suture.  Careful inspection anteriorly and posteriorly revealed good closure.  No damage to the surrounding structures and good hemostasis.  There was a 4-cm subserosal fibroid in the mid anterior uterine fundus.  The anterior peritoneum was closed in a running fashion with 0 Monocryl suture which was  also used to reapproximate the pyramidalis muscle in the midline.  Anterior rectus fascia was closed in a running fashion with a 0 looped PDS suture. Subcutaneous tissue was closed with interrupted plain, and the skin was closed in a subcuticular fashion with a 2-0 Vicryl on a Keith needle. Benzoin and Steri-Strips were applied.  All counts were correct.  The patient was taken to recovery room in stable condition.     Daleen Bo Gaetano Net, M.D.     JET/MEDQ  D:  03/22/2015  T:  03/22/2015  Job:  [id number]

## 2015-03-22 NOTE — Brief Op Note (Signed)
03/22/2015  6:44 PM  PATIENT:  Ellen Davis  34 y.o. female  PRE-OPERATIVE DIAGNOSIS:  primary c-section for failure to descend  POST-OPERATIVE DIAGNOSIS:  primary c-section for failure to descend  PROCEDURE:  Procedure(s): CESAREAN SECTION (N/A)  SURGEON:  Surgeon(s) and Role:    * Everlene Farrier, MD - Primary  PHYSICIAN ASSISTANT:   ASSISTANTS: none   ANESTHESIA:   epidural  EBL:  Total I/O In: 2000 [I.V.:2000] Out: 2100 [Urine:900; Emesis/NG output:500; Blood:700]  BLOOD ADMINISTERED:none  DRAINS: Urinary Catheter (Foley)   LOCAL MEDICATIONS USED:  NONE  SPECIMEN:  Source of Specimen:  placenta  DISPOSITION OF SPECIMEN:  PATHOLOGY  COUNTS:  YES  TOURNIQUET:  * No tourniquets in log *  DICTATION: .Other Dictation: Dictation Number [id number]  PLAN OF CARE: Admit to inpatient   PATIENT DISPOSITION:  PACU - hemodynamically stable.   Delay start of Pharmacological VTE agent (>24hrs) due to surgical blood loss or risk of bleeding: not applicable

## 2015-03-22 NOTE — MAU Note (Signed)
Pt C/O uc's since midnight, had brown mucus discharge @ 0600 this a.m.  Denies bleeding or LOF.

## 2015-03-22 NOTE — Anesthesia Procedure Notes (Signed)
Epidural Patient location during procedure: OB Start time: 03/22/2015 9:22 AM End time: 03/22/2015 9:30 AM  Staffing Anesthesiologist: Suella Broad D Performed by: anesthesiologist   Preanesthetic Checklist Completed: patient identified, site marked, surgical consent, pre-op evaluation, timeout performed, IV checked, risks and benefits discussed and monitors and equipment checked  Epidural Patient position: sitting Prep: ChloraPrep Patient monitoring: heart rate, continuous pulse ox and blood pressure Approach: midline Location: L4-L5 Injection technique: LOR saline  Needle:  Needle type: Tuohy  Needle gauge: 18 G Needle length: 9 cm and 9 Catheter type: closed end flexible Catheter size: 20 Guage Test dose: negative and 1.5% lidocaine with Epi 1:200 K  Assessment Events: blood not aspirated, injection not painful, no injection resistance, negative IV test and no paresthesia  Additional Notes LOR @ 6  Patient identified. Risks/Benefits/Options discussed with patient including but not limited to bleeding, infection, nerve damage, paralysis, failed block, incomplete pain control, headache, blood pressure changes, nausea, vomiting, reactions to medications, itching and postpartum back pain. Confirmed with bedside nurse the patient's most recent platelet count. Confirmed with patient that they are not currently taking any anticoagulation, have any bleeding history or any family history of bleeding disorders. Patient expressed understanding and wished to proceed. All questions were answered. Sterile technique was used throughout the entire procedure. Please see nursing notes for vital signs. Test dose was given through epidural catheter and negative prior to continuing to dose epidural or start infusion. Warning signs of high block given to the patient including shortness of breath, tingling/numbness in hands, complete motor block, or any concerning symptoms with instructions to call  for help. Patient was given instructions on fall risk and not to get out of bed. All questions and concerns addressed with instructions to call with any issues or inadequate analgesia.      Patient tolerated the insertion well without complications.Reason for block:procedure for pain

## 2015-03-22 NOTE — Anesthesia Preprocedure Evaluation (Addendum)
Anesthesia Evaluation  Patient identified by MRN, date of birth, ID band Patient awake    Reviewed: Allergy & Precautions, NPO status , Patient's Chart, lab work & pertinent test results  Airway Mallampati: I  TM Distance: >3 FB Neck ROM: Full    Dental  (+) Teeth Intact   Pulmonary neg pulmonary ROS,    breath sounds clear to auscultation       Cardiovascular negative cardio ROS   Rhythm:Regular Rate:Normal     Neuro/Psych negative neurological ROS  negative psych ROS   GI/Hepatic negative GI ROS, Neg liver ROS,   Endo/Other  negative endocrine ROS  Renal/GU negative Renal ROS  negative genitourinary   Musculoskeletal negative musculoskeletal ROS (+)   Abdominal   Peds  Hematology negative hematology ROS (+)   Anesthesia Other Findings   Reproductive/Obstetrics (+) Pregnancy                            Lab Results  Component Value Date   WBC 10.2 03/22/2015   HGB 11.7* 03/22/2015   HCT 36.1 03/22/2015   MCV 86.4 03/22/2015   PLT 228 03/22/2015   No results found for: INR, PROTIME   Anesthesia Physical Anesthesia Plan  ASA: II  Anesthesia Plan: Epidural   Post-op Pain Management:    Induction:   Airway Management Planned:   Additional Equipment:   Intra-op Plan:   Post-operative Plan:   Informed Consent: I have reviewed the patients History and Physical, chart, labs and discussed the procedure including the risks, benefits and alternatives for the proposed anesthesia with the patient or authorized representative who has indicated his/her understanding and acceptance.     Plan Discussed with:   Anesthesia Plan Comments:         Anesthesia Quick Evaluation

## 2015-03-22 NOTE — H&P (Signed)
Ellen Davis is a 34 y.o. female presenting for UCs. . Maternal Medical History:  Reason for admission: Contractions.   Contractions: Onset was 3-5 hours ago.    Fetal activity: Perceived fetal activity is normal.      OB History    Gravida Para Term Preterm AB TAB SAB Ectopic Multiple Living   1              Past Medical History  Diagnosis Date  . Medical history non-contributory    Past Surgical History  Procedure Laterality Date  . No past surgeries     Family History: family history includes Cancer in her paternal grandmother; Diabetes in her father; Hypertension in her father; Transient ischemic attack in her mother. Social History:  reports that she has never smoked. She has never used smokeless tobacco. She reports that she does not drink alcohol or use illicit drugs.   Prenatal Transfer Tool  Maternal Diabetes: No Genetic Screening: Normal Maternal Ultrasounds/Referrals: Normal Fetal Ultrasounds or other Referrals:  None Maternal Substance Abuse:  No Significant Maternal Medications:  None Significant Maternal Lab Results:  None Other Comments:  None  Review of Systems  Eyes: Negative for blurred vision.  Gastrointestinal: Negative for abdominal pain.  Neurological: Negative for headaches.    Dilation: 4 Effacement (%): 90 Station: -2 Exam by:: Velna Ochs RN Blood pressure 120/70, pulse 100, temperature 97.9 F (36.6 C), temperature source Oral, resp. rate 18, height 5\' 5"  (1.651 m), weight 178 lb (80.74 kg), last menstrual period 06/09/2014. Maternal Exam:  Uterine Assessment: Contraction strength is firm.  Contraction frequency is regular.   Abdomen: Patient reports no abdominal tenderness.   Fetal Exam Fetal State Assessment: Category I - tracings are normal.     Physical Exam  Cardiovascular: Normal rate.   Respiratory: Effort normal.  GI: Soft.    Prenatal labs: ABO, Rh: B/Positive/-- (04/13 0000) Antibody: Negative (04/13 0000) Rubella:  Immune (04/13 0000) RPR: Nonreactive (04/13 0000)  HBsAg: Negative (04/13 0000)  HIV: Non-reactive (04/13 0000)  GBS: Negative (11/13 0000)   Assessment/Plan: 34yo G1P0 @ 40 6/7 weeks in active labor Epidural prn   Pedram Goodchild II,Jakobi Thetford E 03/22/2015, 8:23 AM

## 2015-03-22 NOTE — Progress Notes (Signed)
Pushing about 2 hours with good effort vtx +1 with caput, no change FHT now with some late decels A/P: recommend cesarean section        D/W risks including infection, organ damage, bleeding/transfusion-HIV/Hep, DVT/PE, pneumonia.        Patient and husband state they understand and agree

## 2015-03-23 LAB — CBC
HCT: 30.2 % — ABNORMAL LOW (ref 36.0–46.0)
Hemoglobin: 10 g/dL — ABNORMAL LOW (ref 12.0–15.0)
MCH: 28.9 pg (ref 26.0–34.0)
MCHC: 33.1 g/dL (ref 30.0–36.0)
MCV: 87.3 fL (ref 78.0–100.0)
PLATELETS: 186 10*3/uL (ref 150–400)
RBC: 3.46 MIL/uL — ABNORMAL LOW (ref 3.87–5.11)
RDW: 18.2 % — ABNORMAL HIGH (ref 11.5–15.5)
WBC: 16.7 10*3/uL — ABNORMAL HIGH (ref 4.0–10.5)

## 2015-03-23 NOTE — Anesthesia Postprocedure Evaluation (Signed)
Anesthesia Post Note  Patient: Ellen Davis  Procedure(s) Performed: Procedure(s) (LRB): CESAREAN SECTION (N/A)  Patient location during evaluation: Mother Baby Anesthesia Type: Epidural Level of consciousness: awake, awake and alert, oriented and patient cooperative Pain management: pain level controlled Vital Signs Assessment: post-procedure vital signs reviewed and stable Respiratory status: spontaneous breathing, nonlabored ventilation and respiratory function stable Cardiovascular status: stable Postop Assessment: No headache, No backache, Patient able to bend at knees and No signs of nausea or vomiting Anesthetic complications: no    Last Vitals:  Filed Vitals:   03/23/15 0256 03/23/15 0500  BP: 91/45 88/54  Pulse: 95 85  Temp:  36.7 C  Resp: 18 18    Last Pain:  Filed Vitals:   03/23/15 0657  PainSc: 7     LLE Motor Response: Purposeful movement LLE Sensation: Tingling RLE Motor Response: Purposeful movement RLE Sensation: Tingling      Arthelia Callicott L

## 2015-03-23 NOTE — Addendum Note (Signed)
Addendum  created 03/23/15 0716 by Raenette Rover, CRNA   Modules edited: Clinical Notes   Clinical Notes:  File: [id number]

## 2015-03-23 NOTE — Progress Notes (Signed)
Subjective: Postpartum Day 1: Cesarean Delivery Patient reports nausea and incisional pain. Feeling a bit lightheaded.  Pain controlled with meds.  No active vb.   Objective: Vital signs in last 24 hours: Temp:  [98 F (36.7 C)-100.7 F (38.2 C)] 98 F (36.7 C) (11/25 0500) Pulse Rate:  [65-119] 85 (11/25 0500) Resp:  [3-21] 18 (11/25 0500) BP: (86-148)/(37-126) 88/54 mmHg (11/25 0500) SpO2:  [95 %-99 %] 95 % (11/25 0500)  Physical Exam:  General: alert and cooperative Lochia: appropriate Uterine Fundus: firm Incision: moderate amt old blood.   DVT Evaluation: No evidence of DVT seen on physical exam.   Recent Labs  03/22/15 0830 03/23/15 0548  HGB 11.7* 10.0*  HCT 36.1 30.2*    Assessment/Plan: Status post Cesarean section. Doing well postoperatively.  Continue current care. Advance diet Change bandage  Ellen Davis 03/23/2015, 9:14 AM

## 2015-03-23 NOTE — Lactation Note (Signed)
This note was copied from the chart of [place]. Lactation Consultation Note  Patient Name: Ellen Davis [id number] Date: 03/23/2015 Reason for consult: Initial assessment;Difficult latch   Asked to see mother upon request of mother and mom's RN. Infant now 4 hours old with 2 BF for 10 minutes, 3 attempts, 0 voids, 5 stools and 2 spit ups since birth. He was born at 61w 6d gestation and weighed 8 lb 5.7 oz. Infant was latched and nursing intermittently on right breast in cradle hold when I went into room. Infant detatched independently. Infant continued to Cue to feed. Assisted mom in placing infant STS in football hold to right breast, infant licked a little while and did not latch after several attempts. Repositioned him to left breast in football hold assisting mom to get infant latched. He did latch after several tries with flanged lips and rhythmic suckling. A few swallows were heard. Mom did feel pain with latch that improved with positioning and suckling. Discussed BF basics with mom. Showed mom how to hand express, she returned demonstration, gtts of colostrum noted. Moms breasts are small and firm with everted nipples that are inverted in the center with and without stimulation. Areola is thick and difficult to compress. Mom has a #20 NS at the bedside, she says she has used it once, we tried to latch infant with it and he would not suck, he did latch to left breast twice without NS for this feeding. GM was present in room and kept giving infant breathing space, discussed with mom about positioning and why breathing space is not needed if infant positioned well. Enc mom to massage/compress breast with feeding. Discussed stomach size, BF basics, nutritional needs of NB, NL NB feeding behavior, STS, and alignment, feeding 8-12 x in 24 hours at first feeding cues. [facility] Brochure given, advised of Support Groups. Phone # OP Services and BF Resources. Enc mom to maintain I/O Record for infant. Enc mom to  call for assistance as needed.    Maternal Data Formula Feeding for Exclusion: No Has patient been taught Hand Expression?: Yes Does the patient have breastfeeding experience prior to this delivery?: No  Feeding Feeding Type: Breast Fed Length of feed: 15 min  LATCH Score/Interventions Latch: Repeated attempts needed to sustain latch, nipple held in mouth throughout feeding, stimulation needed to elicit sucking reflex. Intervention(s): Adjust position;Assist with latch;Breast massage;Breast compression  Audible Swallowing: A few with stimulation  Type of Nipple: Everted at rest and after stimulation (Both nipples indent in center with and without stimulation)  Comfort (Breast/Nipple): Filling, red/small blisters or bruises, mild/mod discomfort  Problem noted: Mild/Moderate discomfort (With initial latch, improves with feeding) Interventions (Mild/moderate discomfort): Hand expression  Hold (Positioning): Assistance needed to correctly position infant at breast and maintain latch. Intervention(s): Breastfeeding basics reviewed;Support Pillows;Position options;Skin to skin  LATCH Score: 6  Lactation Tools Discussed/Used WIC Program: No   Consult Status Consult Status: Follow-up Date: 03/24/15 Follow-up type: In-patient    Debby Freiberg Doha Boling 03/23/2015, 2:08 PM

## 2015-03-24 LAB — RPR: RPR: NONREACTIVE

## 2015-03-24 MED ORDER — FERROUS SULFATE 325 (65 FE) MG PO TABS: 325.0000 mg | ORAL_TABLET | Freq: Two times a day (BID) | ORAL | Status: DC

## 2015-03-24 NOTE — Progress Notes (Addendum)
Post Partum Day 2 Subjective: no complaints and up ad lib  Objective: Blood pressure 83/66, pulse 66, temperature 98.5 F (36.9 C), temperature source Oral, resp. rate 19, height 5\' 5"  (1.651 m), weight 178 lb (80.74 kg), last menstrual period 06/09/2014, SpO2 93 %, unknown if currently breastfeeding.  Physical Exam:  General: alert and cooperative Lochia: appropriate Uterine Fundus: firm Incision: no significant drainage DVT Evaluation: No evidence of DVT seen on physical exam.   Recent Labs  03/22/15 0830 03/23/15 0548  HGB 11.7* 10.0*  HCT 36.1 30.2*    Assessment/Plan: Plan for discharge tomorrow     LOS: 2 days   Ellen Davis 03/24/2015, 9:03 AM

## 2015-03-24 NOTE — Lactation Note (Signed)
This note was copied from the chart of [place]. Lactation Consultation Note  Patient Name: Ellen Davis [id number] Date: 03/24/2015 Reason for consult: Follow-up assessment   With this mom and term baby, now 49 hours old and cluster feeding, Mom has inerted nipple s that are now craked, red and extremely painful with baby's strong suckles. DEP set up to protect mom's supply, and if  Not painfulo, mom may choose to pump and bottle feed. Mom wants to provide EBm, but was sksing for formula, due to severe pain. I told her we would try pumping and hand expression first, but formula was fine if this is what the wants. Mom nurse, April, will assist mom with pumping, after breast feeding done.    Maternal Data Formula Feeding for Exclusion: No Has patient been taught Hand Expression?: Yes Does the patient have breastfeeding experience prior to this delivery?: No  Feeding Feeding Type: Breast Fed Length of feed: 20 min  LATCH Score/Interventions Latch: Grasps breast easily, tongue down, lips flanged, rhythmical sucking. Intervention(s): Skin to skin;Teach feeding cues;Waking techniques Intervention(s): Adjust position;Assist with latch  Audible Swallowing: Spontaneous and intermittent (visible and easily expressed colostrum, cluster feeding) Intervention(s): Skin to skin;Hand expression  Type of Nipple: Inverted (everted and inverted in center)  Comfort (Breast/Nipple): Engorged, cracked, bleeding, large blisters, severe discomfort Problem noted: Cracked, bleeding, blisters, bruises (inverted nipple adhesions being pulled, cracked and painful nipples)  Problem noted: Severe discomfort Interventions (Mild/moderate discomfort): Comfort gels Interventions (Severe discomfort): Observe pumping;Double electric pum  Hold (Positioning): Assistance needed to correctly position infant at breast and maintain latch. Intervention(s): Breastfeeding basics reviewed;Support Pillows;Position options;Skin  to skin  LATCH Score: 5  Lactation Tools Discussed/Used Pump Review: Setup, frequency, and cleaning;Milk Storage;Other (comment) (initiation settingchrsitine [name] Rn IBCLC) Initiated by:: c [name] Date initiated:: 03/24/15   Consult Status Consult Status: Follow-up Date: 03/25/15 Follow-up type: In-patient    Tonna Corner 03/24/2015, 9:51 AM

## 2015-03-25 ENCOUNTER — Inpatient Hospital Stay (HOSPITAL_COMMUNITY): Admission: RE | Admit: 2015-03-25 | Payer: BLUE CROSS/BLUE SHIELD | Source: Ambulatory Visit

## 2015-03-25 MED ORDER — IBUPROFEN 600 MG PO TABS: 600.0000 mg | ORAL_TABLET | Freq: Four times a day (QID) | ORAL | Status: DC

## 2015-03-25 MED ORDER — OXYCODONE-ACETAMINOPHEN 5-325 MG PO TABS: 1.0000 | ORAL_TABLET | ORAL | Status: DC | PRN

## 2015-03-25 NOTE — Progress Notes (Signed)
Discharge instructions explained to family (mother, father, grandmother, aunt) in great detail using language line. Nurse phone was placed on speaker phone so all family that was present could hear

## 2015-03-25 NOTE — Discharge Summary (Signed)
Obstetric Discharge Summary Reason for Admission: onset of labor Prenatal Procedures: ultrasound Intrapartum Procedures: cesarean: low cervical, transverse Postpartum Procedures: none Complications-Operative and Postpartum: none HEMOGLOBIN  Date Value Ref Range Status  03/23/2015 10.0* 12.0 - 15.0 g/dL Final   HCT  Date Value Ref Range Status  03/23/2015 30.2* 36.0 - 46.0 % Final    Physical Exam:  General: alert and cooperative Lochia: appropriate Uterine Fundus: firm Incision: healing well, no significant drainage DVT Evaluation: No evidence of DVT seen on physical exam.  Discharge Diagnoses: Term Pregnancy-delivered  Discharge Information: Date: 03/25/2015 Activity: pelvic rest Diet: routine Medications: PNV, Ibuprofen and Percocet Condition: stable Instructions: refer to practice specific booklet Discharge to: home Follow-up Information    Schedule an appointment as soon as possible for a visit in 2 weeks to follow up.      Newborn Data: Live born female  Birth Weight: 8 lb 5.7 oz (3790 g) APGAR: 8, 9  Home with mother.  Ellen Davis 03/25/2015, 8:25 AM

## 2015-03-25 NOTE — Lactation Note (Signed)
This note was copied from the chart of [place]. Lactation Consultation Note  Patient Name: Ellen Davis [id number] Date: 03/25/2015 Reason for consult: Follow-up assessment;Breast/nipple pain;Difficult latch   Follow up at mom's request prior to D/C. Mom C/O pain and fullness to breast, she did not pump after icing. Reviewed Engorgement protocol again with mom and supplementation amounts. Mom asked what she should do if infant cries after feeding, encouraged her to hold and swaddle infant for comfort. Enc mom to call with questions/concerns.   Maternal Data Formula Feeding for Exclusion: No Reason for exclusion: Mother's choice to formula and breast feed on admission Has patient been taught Hand Expression?: Yes Does the patient have breastfeeding experience prior to this delivery?: No  Feeding Feeding Type: Formula Nipple Type: Slow - flow Length of feed: 5 min  LATCH Score/Interventions                      Lactation Tools Discussed/Used WIC Program: No Pump Review: Setup, frequency, and cleaning;Milk Storage Initiated by:: Nonah Mattes, RN, IBCLC Date initiated:: 03/25/15   Consult Status Consult Status: Follow-up Date: 04/03/15 Follow-up type: Out-patient    Debby Freiberg Maleiyah Releford 03/25/2015, 12:16 PM

## 2015-03-25 NOTE — Lactation Note (Addendum)
This note was copied from the chart of [place]. Lactation Consultation Note  Patient Name: Ellen Davis [id number] Date: 03/25/2015 Reason for consult: Follow-up assessment;Breast/nipple pain;Difficult latch   Follow up with mom prior to D/C. Infant 68 hours old and weighs 7 lb 13.9 oz today with weight loss of 6% since birth. Infant BF x 3 for 10-30 minutes, 4 attempts, 9 Bottle feedings of formula of 10-30 cc, 2 voids and 2 stools in last 24 hours. Latch scores by bedside RN =4. Mom reports she has very painful nipples, she is using NS with feedings. Breast are filling and firm today. Nipples with divots in center and some excoriation noted. Discussed supply and demand and need to pump breasts since infant not feeding well at breast to maintain milk supply. Mom reports she has a Medela DEBP at home. Engorgement prevention/treatment discussed. Engorgement Treatment for Nursing Mothers Handout given to mom. Encouraged mom to rest nipples as needed to heal as both are excoriated and to pump for 15 minutes and feed infant any EBM prior to formula.Mom pumped 9 cc EBM. Ice packs placed for 20 minutes, then encouraged mom to pump again. Encouraged her to feed baby at breast every 2-3 hours and to pump if not BF. She is wearing comfort gels and was told she can use coconut oil or Olive oil to nipples when not using comfort Gels. Gave her Breast shells to wear between feeding during day for nipple excoriation. Enc her to rub expressed colostrum on nipples post feed/pumping. Mom reports she plans to keep BF and give formula as needed. Enc her to use her EBM first. Reviewed BF information in Taking Care of Baby and Me Booklet. Reviewed [place] with phone #, OP Services. Op [facility] appointment set up, appointment reminder given. Discussed with  Mom's RN April.   Maternal Data Formula Feeding for Exclusion: No Reason for exclusion: Mother's choice to formula and breast feed on admission Has patient been taught  Hand Expression?: Yes Does the patient have breastfeeding experience prior to this delivery?: No  Feeding Feeding Type: Formula Nipple Type: Slow - flow Length of feed: 5 min  LATCH Score/Interventions                      Lactation Tools Discussed/Used WIC Program: No Pump Review: Setup, frequency, and cleaning;Milk Storage Initiated by:: Nonah Mattes, RN, IBCLC Date initiated:: 03/25/15   Consult Status Consult Status: Follow-up Date: 04/03/15 Follow-up type: Out-patient    Debby Freiberg Jamica Woodyard 03/25/2015, 10:14 AM

## 2015-03-26 NOTE — Addendum Note (Signed)
Addendum  created 03/26/15 1306 by Laverle Hobby, CRNA   Modules edited: Charges VN

## 2015-03-27 ENCOUNTER — Encounter (HOSPITAL_COMMUNITY): Payer: Self-pay | Admitting: Obstetrics and Gynecology

## 2015-03-30 ENCOUNTER — Encounter (HOSPITAL_COMMUNITY): Payer: Self-pay | Admitting: *Deleted

## 2015-04-03 ENCOUNTER — Ambulatory Visit (HOSPITAL_COMMUNITY)
Admit: 2015-04-03 | Discharge: 2015-04-03 | Disposition: A | Discharge status: Home / Self Care | Payer: BLUE CROSS/BLUE SHIELD | Attending: Obstetrics and Gynecology | Admitting: Obstetrics and Gynecology

## 2015-04-03 NOTE — Lactation Note (Signed)
Lactation Consult  Mother's reason for visit: nipple/breast pain; pain with BF Visit Type:  Outpatient Lactation Consultation Appointment Notes:  Mom is a P1 with minor cracking of nipples and painful latches.  Mom is mostly formula feeding d/t pain and "engorgement".  Mom is Breastfeeding 3x/day during the day and formula feeding with each feeding 8x/day (even after breastfeeding).  Mom is not breastfeeding at all during the night.  Mom's native language is [place] but does speak [place].  LC did have to clarify spoken communication several times during consult.  Mom pre-pumped 30 ml EBM prior to [facility] visit only from one side.  Mom had a lack of basic knowledge regarding supply/demand, milk production, risk of formula feeding r/t milk production, engorgement, and proper positioning for latching.  LC did a lot of basic teaching, worked with mom on positioning (decreasing initial latching pain from a "5" to a "0"), reviewed hand expression, and gave the book "Breastfeeding:  Getting off to a [employer]."  Reviewed basic information in book pointing out page numbers, etc.... Mom stated she would read the book and acknowledged that she wanted the extra information.  Infant transferred 120 ml from the breast (104 ml from Right side, 16 ml from second side, Left).  Infant was satisfied at end of feeding.  FOB acknowledged that the amount transferred "was his feeding", meaning he did not need extra formula after the feeding based on the amount.  Encouraged mom to follow-up at next week's support group for an additional weight check since mom now plans to breastfeed at least 8-12 times per day with less formula feeding.  Follow-up appointment with Peds on Dec. 28, 2016.  Peds may want to follow-up with thick white substance noted on tongue (current visit seems to wipe off easily which could be milk; infant did not seem to be in pain when LC wiped his mouth with a wet washcloth).  LC also noted while parents  changed infant's diaper, scrotum appeared bilaterally large in size for a 50 day old newborn.  Discharge Peds Summary noted a left hydrocele.   Consult:  Initial Lactation Consultant:  Merlene Laughter  ________________________________________________________________________ [name] Name: Ellen Davis Pediatrician: Burt Knack Gender: female Gestational Age: [redacted]w[redacted]d (At Birth) Birth Weight: 8 lb 5.7 oz (3790 g) Weight at Discharge: Weight: 7 lb 13.9 oz (3570 g)Date of Discharge: 03/25/2015 Filed Weights   03/22/15 1809 03/23/15 2300 03/25/15 0000  Weight: 8 lb 5.7 oz (3790 g) 7 lb 14.5 oz (3586 g) 7 lb 13.9 oz (3570 g)     Today's (04/03/15)  weight: 9 lbs, 7.5 oz - 4296 grams  (1lbs, 9 oz (=25 oz) weight gain in 12 days)  ________________________________________________________________________  Mother's Name: Ellen Davis Type of delivery:  C-Section Breastfeeding Experience:  P1 Maternal Medical Conditions:  none Maternal Medications:  Ibuprofen q8hrs; PNV daily  ________________________________________________________________________  Breastfeeding History (Post Discharge)  Frequency of breastfeeding: 3x/day during the day only Duration of feeding:  10-20 minutes  Supplementation  Formula:  Volume 60 ml Frequency:  8-10x /day  Total volume per day:  480 ml  Method:  Bottle,   Pumping  Type of pump:  Medela pump in style Frequency:  1-2x/day for 10 minutes each time Volume:  30-40 ml  Infant Intake and Output Assessment  Voids:  7-8 in 24 hrs.  Color:  Clear yellow Stools:  4-5 in 24 hrs.  Color:  Yellow/seedy  ________________________________________________________________________  Maternal Breast Assessment  Breast:  Full and Compressible Nipple:  Erect and Cracked Pain level:  5 with initial latching Pain interventions:  Inverted shells and Breast pump 1-2x/ day with DEBP but only pumping one side at a  time  _______________________________________________________________________ Feeding Assessment/Evaluation  Initial feeding assessment:  Right Breast  Infant's oral assessment:  Variance - noted posterior lingual frenulum when infant is crying.  However, infant can lateralize tongue and elevate tongue with rhythmical sucking motion on gloved finger without losing latch on finger.  Lots of white substance noted on tongue that does seem to easily wipe off with wet wash cloth.  No diaper rash noted.  Due to 120 ml total milk transfer at breast at 12 days of life, frenulum does not appear at this time to be causing a restriction affecting milk transfer.    Attached assessment:  Mom latched infant in semi-shallow latch on right side as first side.  Mom was initially allowing infant to self-latch with a modified cradle hold (right hand holding baby's head on right breast, no sandwiching or support of breast with latching from left hand).  Infant tucks bottom lip with latching.  LC taught mom how to latch using cross-cradle hold, sandwiching and support breast for latching, asymmetrical latching technique, and using chin tug to flange bottom lip.  When infant latched with depth and flanged lips, mom stated the pain was a "0".  Infant fed on right breast, rhythmical, nutritive sucking motion for 20 minutes and transferred 104 ml of breastmilk.  Lots audible swallows.  Right breast was much softer after feeding.  After infant came off breast, LC re-weighed infant and he began to awaken.  LC suggested re-latching infant to opposite breast (left breast) for additional feeding.  LC explained rationale for latching to both breast for each feeding as mom has only been feeding on one side each time.  Mom latched infant to left breast with only minimal verbal cuing from [facility] with latching.  Infant fed from Right breast for 10 minutes and transferred an additional 16 ml for a total of 120 ml breastmilk transfer in 30  minutes.    Positioning:  Cross cradle Right Breast with LC assistance and teaching.  Initially mom was latching with a modified cradle hold with a shallow latch.    LATCH documentation:  Right Breast  Latch:  2 = Grasps breast easily, tongue down, lips flanged, rhythmical sucking.  Flanging of bottom lip needed by LC to eliminate mom's pain with initial latching  Audible swallowing:  2 = Spontaneous and intermittent  Type of nipple:  2 = Everted at rest and after stimulation  Comfort (Breast/Nipple):  1 = Filling, red/small blisters or bruises, mild/mod discomfort  Hold (Positioning):  1 = Assistance needed to correctly position infant at breast and maintain latch  LATCH score:  8  Suck assessment:  Nutritive  Tools:  Wearing shells inside bra.    Pre-feed weight:  4296 g  (9 lb. 7.5 oz.) Post-feed weight: 4400 g (9 lb. 11.2 oz.) Amount transferred:  104 ml Amount supplemented:  0 ml  Additional Feeding Assessment - Left Breast; Cross-Cradle Hold  LATCH documentation:  Left Breast; Cross-Cradle Hold  Latch:  2 = Grasps breast easily, tongue down, lips flanged, rhythmical sucking.  Audible swallowing:  2 = Spontaneous and intermittent  Type of nipple:  2 = Everted at rest and after stimulation  Comfort (Breast/Nipple):  1 = Filling, red/small blisters or bruises, mild/mod discomfort  Hold (Positioning):  2 =  No assistance needed to correctly position infant at breast ; only verbal assistance given with flanging of bottom lip.  LATCH score:  9  Attached assessment:  Deep  Lips flanged:  Yes.    Lips untucked:  Yes.    Suck assessment:  Nutritive   Pre-feed weight:  4400 g  (9 lb. 11.2 oz.) Post-feed weight:  4416 g (9 lb. 11.8 oz.) Amount transferred:  16 ml Amount supplemented:  0 ml   Total amount transferred:  120 ml Total supplement given:  0 ml  Plan of Care:  (written instruction given) 1.  Breastfeed &/or Pump each breast 8-12 times per day to maintain milk  supply.  Discussed supply/demand.   2.  Feed baby 8-12 times per day watching for cues that baby is hungry; 3.  Offer both breast with each feeding allowing infant to feed from first breast until he comes off or goes to sleep.  Then, awaken infant and offer second breast. 4.  Assure depth on breast with flanged lips and wide latch. 5.  Read "Breastfeeding:  A Great Start" book given by [facility] noting pages turned down and information circled in book by [facility] for latching and feeding information. 6.  Supplement with EBM (if available) or formula after breastfeeding first on each breast and only supplement if baby still acts hungry after breastfeeding.   7.  Follow-up with Hospital support group on Monday nights or Tuesdays next week for a weight check before next Peds Appt to evaluate the increase in breastfeeding (or exclusive breastfeeding) progress.   8.  Follow-up with Pediatrician on Dec. 28, 2016 for scheduled visit.

## 2015-04-26 ENCOUNTER — Ambulatory Visit (INDEPENDENT_AMBULATORY_CARE_PROVIDER_SITE_OTHER): Payer: BLUE CROSS/BLUE SHIELD | Admitting: Physician Assistant

## 2015-04-26 VITALS — BP 88/64 | HR 75 | Temp 97.8°F | Resp 16 | Ht 64.0 in | Wt 151.0 lb

## 2015-04-26 DIAGNOSIS — H60391 Other infective otitis externa, right ear: Secondary | ICD-10-CM

## 2015-04-26 MED ORDER — CIPROFLOXACIN-HYDROCORTISONE 0.2-1 % OT SUSP: 3.0000 [drp] | Freq: Two times a day (BID) | OTIC | Status: DC

## 2015-04-26 NOTE — Progress Notes (Signed)
   04/26/2015 1:34 PM   DOB: 1980-08-08 / MRN: [id number]  SUBJECTIVE:  Ellen Davis is a 34 y.o. female presenting for right sided ear pain times three days.  Denies a change in hearing, reports pain with touching the inside canal of there ear and feels there is something in the canal.No fever, chills, nausea.    She is allergic to penicillins and sulfa antibiotics.   She  has a past medical history of Medical history non-contributory.    She  reports that she has never smoked. She has never used smokeless tobacco. She reports that she does not drink alcohol or use illicit drugs. She  reports that she currently engages in sexual activity. The patient  has past surgical history that includes No past surgeries and Cesarean section (N/A, 03/22/2015).  Her family history includes Cancer in her paternal grandmother; Diabetes in her father and mother; Hypertension in her father; Stroke in her mother; Transient ischemic attack in her mother.  Review of Systems  Constitutional: Negative for fever and chills.  Eyes: Negative for blurred vision.  Respiratory: Negative for cough and shortness of breath.   Cardiovascular: Negative for chest pain.  Gastrointestinal: Negative for nausea and abdominal pain.  Genitourinary: Negative for dysuria, urgency and frequency.  Musculoskeletal: Negative for myalgias.  Skin: Negative for rash.  Neurological: Negative for dizziness, tingling and headaches.  Psychiatric/Behavioral: Negative for depression. The patient is not nervous/anxious.     Problem list and medications reviewed and updated by myself where necessary, and exist elsewhere in the encounter.   OBJECTIVE:  BP 88/64 mmHg  Pulse 75  Temp(Src) 97.8 F (36.6 C) (Oral)  Resp 16  Ht 5\' 4"  (1.626 m)  Wt 151 lb (68.493 kg)  BMI 25.91 kg/m2  SpO2 97%  Physical Exam  Constitutional: She is oriented to person, place, and time. She appears well-nourished. No distress.  HENT:  Right Ear: Tympanic  membrane normal. No decreased hearing is noted.  Left Ear: Tympanic membrane normal. No decreased hearing is noted.  Ears:  Eyes: EOM are normal. Pupils are equal, round, and reactive to light.  Cardiovascular: Normal rate.   Pulmonary/Chest: Effort normal.  Abdominal: She exhibits no distension.  Neurological: She is alert and oriented to person, place, and time. No cranial nerve deficit. Gait normal.  Skin: Skin is dry. She is not diaphoretic.  Psychiatric: She has a normal mood and affect.  Vitals reviewed.   No results found for this or any previous visit (from the past 48 hour(s)).  ASSESSMENT AND PLAN  [name] was seen today for ear pain.  Diagnoses and all orders for this visit:  Otitis, externa, infective, right: HPI and exam consistent.  RTC or call in three days if not improved.  She is breast feeding. Mothers milk consulted and given this is topical there is little to no risk.    Other orders -     ciprofloxacin-hydrocortisone (CIPRO HC) otic suspension; Place 3 drops into the right ear 2 (two) times daily.    The patient was advised to call or return to clinic if she does not see an improvement in symptoms or to seek the care of the closest emergency department if she worsens with the above plan.   Philis Fendt, MHS, PA-C [facility] 04/26/2015 1:34 PM

## 2015-04-27 ENCOUNTER — Telehealth: Payer: Self-pay

## 2015-04-27 NOTE — Telephone Encounter (Signed)
Got fax from pharm advising that Cipro HC otic drops are not covered under ins w/out a PA. PA form indicates that preferred meds need to be tried first which appear to be: Cortosporin otic solution , Coly-Mycin C,  And Ofloxacin 0.3% otic solution. Would any of these be appropriate to change to for pt?

## 2015-04-27 NOTE — Telephone Encounter (Signed)
oflox please.

## 2015-04-30 MED ORDER — OFLOXACIN 0.3 % OT SOLN: 3.0000 [drp] | Freq: Two times a day (BID) | OTIC | Status: DC

## 2015-04-30 NOTE — Telephone Encounter (Signed)
Sent in Rx for the oflox drops w/note to replace the Rx for cipro otic.

## 2017-01-13 LAB — OB RESULTS CONSOLE GC/CHLAMYDIA
Chlamydia: NEGATIVE
GC PROBE AMP, GENITAL: NEGATIVE

## 2017-01-13 LAB — OB RESULTS CONSOLE RUBELLA ANTIBODY, IGM: RUBELLA: IMMUNE

## 2017-01-13 LAB — OB RESULTS CONSOLE ANTIBODY SCREEN: ANTIBODY SCREEN: NEGATIVE

## 2017-01-13 LAB — OB RESULTS CONSOLE ABO/RH: RH TYPE: POSITIVE

## 2017-01-13 LAB — OB RESULTS CONSOLE RPR: RPR: NONREACTIVE

## 2017-01-13 LAB — OB RESULTS CONSOLE HIV ANTIBODY (ROUTINE TESTING): HIV: NONREACTIVE

## 2017-01-13 LAB — OB RESULTS CONSOLE HEPATITIS B SURFACE ANTIGEN: Hepatitis B Surface Ag: NEGATIVE

## 2017-07-22 ENCOUNTER — Encounter (HOSPITAL_COMMUNITY): Payer: Self-pay | Admitting: *Deleted

## 2017-07-23 ENCOUNTER — Telehealth (HOSPITAL_COMMUNITY): Payer: Self-pay | Admitting: *Deleted

## 2017-07-23 NOTE — Telephone Encounter (Signed)
Preadmission screen  

## 2017-07-27 ENCOUNTER — Encounter (HOSPITAL_COMMUNITY): Payer: Self-pay

## 2017-08-04 NOTE — Patient Instructions (Signed)
Ellen Davis  08/04/2017   Your procedure is scheduled on:  08/06/2017  Enter through the Main Entrance of [facility] up the phone at the desk and dial [phone]  Call this number if you have problems the morning of surgery:[phone]  Remember:   Do not eat food:(After Midnight) Desps de medianoche.  Do not drink clear liquids: (After Midnight) Desps de medianoche.  Take these medicines the morning of surgery with A SIP OF WATER: none   Do not wear jewelry, make-up or nail polish.  Do not wear lotions, powders, or perfumes. Do not wear deodorant.  Do not shave 48 hours prior to surgery.  Do not bring valuables to the hospital.  [facility] is not   responsible for any belongings or valuables brought to the hospital.  Contacts, dentures or bridgework may not be worn into surgery.  Leave suitcase in the car. After surgery it may be brought to your room.  For patients admitted to the hospital, checkout time is 11:00 AM the day of              discharge.    N/A   Please read over the following fact sheets that you were given:   Surgical Site Infection Prevention

## 2017-08-04 NOTE — H&P (Signed)
Ellen Davis is a 37 y.o. female presenting for repeat c/s.  Pregnancy uncomplicated except AMA with normal NIPS.. OB History    Gravida  2   Para  1   Term  1   Preterm      AB      Living  1     SAB      TAB      Ectopic      Multiple  0   Live Births  1          Past Medical History:  Diagnosis Date  . Medical history non-contributory    Past Surgical History:  Procedure Laterality Date  . CESAREAN SECTION N/A 03/22/2015   Procedure: CESAREAN SECTION;  Surgeon: Everlene Farrier, MD;  Location: [facility];  Service: Obstetrics;  Laterality: N/A;  . NO PAST SURGERIES     Family History: family history includes Cancer in her paternal grandmother; Diabetes in her father and mother; Hypertension in her father and mother; Stroke in her mother; Transient ischemic attack in her mother. Social History:  reports that she has never smoked. She has never used smokeless tobacco. She reports that she does not drink alcohol or use drugs.     Maternal Diabetes: No Genetic Screening: Normal Maternal Ultrasounds/Referrals: Normal Fetal Ultrasounds or other Referrals:  None Maternal Substance Abuse:  No Significant Maternal Medications:  None Significant Maternal Lab Results:  None Other Comments:  None  ROS History   unknown if currently breastfeeding. Exam Physical Exam  Prenatal labs: ABO, Rh: B/Positive/-- (09/18 0000) Antibody: Negative (09/18 0000) Rubella: Immune (09/18 0000) RPR: Nonreactive (09/18 0000)  HBsAg: Negative (09/18 0000)  HIV: Non-reactive (09/18 0000)  GBS:     Assessment/Plan: IUP at 39 weeks Prev C/S for repeat Risks and benefits of C/S were discussed.  All questions were answered and informed consent was obtained.  Plan to proceed with low segment transverse Cesarean Section.   Valli Randol C 08/04/2017, 7:41 AM

## 2017-08-05 ENCOUNTER — Encounter (HOSPITAL_COMMUNITY): Payer: Self-pay

## 2017-08-05 ENCOUNTER — Encounter (HOSPITAL_COMMUNITY)
Admission: AD | Disposition: A | Discharge status: Home / Self Care | Payer: Self-pay | Source: Ambulatory Visit | Attending: Obstetrics & Gynecology

## 2017-08-05 ENCOUNTER — Inpatient Hospital Stay (HOSPITAL_COMMUNITY): Payer: 59 | Admitting: Anesthesiology

## 2017-08-05 ENCOUNTER — Encounter (HOSPITAL_COMMUNITY)
Admission: RE | Admit: 2017-08-05 | Discharge: 2017-08-05 | Disposition: A | Discharge status: Home / Self Care | Payer: 59 | Source: Ambulatory Visit

## 2017-08-05 ENCOUNTER — Inpatient Hospital Stay (HOSPITAL_COMMUNITY)
Admission: AD | Admit: 2017-08-05 | Discharge: 2017-08-07 | DRG: 788 | Disposition: A | Discharge status: Home / Self Care | Payer: 59 | Source: Ambulatory Visit | Attending: Obstetrics & Gynecology | Admitting: Obstetrics & Gynecology

## 2017-08-05 DIAGNOSIS — Z3A38 38 weeks gestation of pregnancy: Secondary | ICD-10-CM

## 2017-08-05 DIAGNOSIS — O34211 Maternal care for low transverse scar from previous cesarean delivery: Principal | ICD-10-CM | POA: Diagnosis present

## 2017-08-05 DIAGNOSIS — Z98891 History of uterine scar from previous surgery: Secondary | ICD-10-CM

## 2017-08-05 LAB — RPR: RPR: NONREACTIVE

## 2017-08-05 LAB — TYPE AND SCREEN
ABO/RH(D): B POS
Antibody Screen: NEGATIVE

## 2017-08-05 LAB — CBC
HCT: 37.1 % (ref 36.0–46.0)
HEMOGLOBIN: 12.4 g/dL (ref 12.0–15.0)
MCH: 28.4 pg (ref 26.0–34.0)
MCHC: 33.4 g/dL (ref 30.0–36.0)
MCV: 85.1 fL (ref 78.0–100.0)
PLATELETS: 237 10*3/uL (ref 150–400)
RBC: 4.36 MIL/uL (ref 3.87–5.11)
RDW: 16.5 % — ABNORMAL HIGH (ref 11.5–15.5)
WBC: 8.9 10*3/uL (ref 4.0–10.5)

## 2017-08-05 SURGERY — Surgical Case
Anesthesia: Regional

## 2017-08-05 SURGERY — Surgical Case
Anesthesia: Spinal

## 2017-08-05 MED ORDER — TETANUS-DIPHTH-ACELL PERTUSSIS 5-2.5-18.5 LF-MCG/0.5 IM SUSP: 0.5000 mL | Freq: Once | INTRAMUSCULAR | Status: DC

## 2017-08-05 MED ORDER — LACTATED RINGERS IV SOLN
INTRAVENOUS | Status: DC | PRN
  Administered 2017-08-05 (×2): via INTRAVENOUS

## 2017-08-05 MED ORDER — MORPHINE SULFATE (PF) 0.5 MG/ML IJ SOLN
INTRAMUSCULAR | Status: DC | PRN
  Administered 2017-08-05: .2 mg via INTRATHECAL

## 2017-08-05 MED ORDER — BUPIVACAINE IN DEXTROSE 0.75-8.25 % IT SOLN
INTRATHECAL | Status: DC | PRN
  Administered 2017-08-05: 1.6 mL via INTRATHECAL

## 2017-08-05 MED ORDER — LACTATED RINGERS IV SOLN
INTRAVENOUS | Status: DC
  Administered 2017-08-05 (×2): via INTRAVENOUS

## 2017-08-05 MED ORDER — SIMETHICONE 80 MG PO CHEW
80.0000 mg | CHEWABLE_TABLET | ORAL | Status: DC | PRN
  Administered 2017-08-06: 80 mg via ORAL

## 2017-08-05 MED ORDER — SIMETHICONE 80 MG PO CHEW
80.0000 mg | CHEWABLE_TABLET | Freq: Three times a day (TID) | ORAL | Status: DC
  Administered 2017-08-05 – 2017-08-06 (×2): 80 mg via ORAL
  Filled 2017-08-05 (×3): qty 1

## 2017-08-05 MED ORDER — PHENYLEPHRINE 8 MG IN D5W 100 ML (0.08MG/ML) PREMIX OPTIME
INJECTION | INTRAVENOUS | Status: DC | PRN
  Administered 2017-08-05: 60 ug/min via INTRAVENOUS

## 2017-08-05 MED ORDER — IBUPROFEN 600 MG PO TABS
600.0000 mg | ORAL_TABLET | Freq: Four times a day (QID) | ORAL | Status: DC
  Administered 2017-08-05 – 2017-08-07 (×7): 600 mg via ORAL
  Filled 2017-08-05 (×7): qty 1

## 2017-08-05 MED ORDER — OXYCODONE HCL 5 MG/5ML PO SOLN: 5.0000 mg | Freq: Once | ORAL | Status: DC | PRN

## 2017-08-05 MED ORDER — ACETAMINOPHEN 325 MG PO TABS: 650.0000 mg | ORAL_TABLET | ORAL | Status: DC | PRN

## 2017-08-05 MED ORDER — LACTATED RINGERS IV SOLN
INTRAVENOUS | Status: DC
  Administered 2017-08-05: 12:00:00 via INTRAVENOUS

## 2017-08-05 MED ORDER — ONDANSETRON HCL 4 MG/2ML IJ SOLN: 4.0000 mg | Freq: Three times a day (TID) | INTRAMUSCULAR | Status: DC | PRN

## 2017-08-05 MED ORDER — DIPHENHYDRAMINE HCL 25 MG PO CAPS: 25.0000 mg | ORAL_CAPSULE | Freq: Four times a day (QID) | ORAL | Status: DC | PRN

## 2017-08-05 MED ORDER — SCOPOLAMINE 1 MG/3DAYS TD PT72
MEDICATED_PATCH | TRANSDERMAL | Status: DC | PRN
  Administered 2017-08-05: 1 via TRANSDERMAL

## 2017-08-05 MED ORDER — HYDROMORPHONE HCL 1 MG/ML IJ SOLN
INTRAMUSCULAR | Status: AC
  Filled 2017-08-05: qty 1

## 2017-08-05 MED ORDER — DEXAMETHASONE SODIUM PHOSPHATE 4 MG/ML IJ SOLN
INTRAMUSCULAR | Status: AC
  Filled 2017-08-05: qty 1

## 2017-08-05 MED ORDER — MORPHINE SULFATE (PF) 0.5 MG/ML IJ SOLN
INTRAMUSCULAR | Status: AC
Start: 2017-08-05 — End: ?
  Filled 2017-08-05: qty 10

## 2017-08-05 MED ORDER — SIMETHICONE 80 MG PO CHEW
80.0000 mg | CHEWABLE_TABLET | ORAL | Status: DC
  Administered 2017-08-06 (×2): 80 mg via ORAL
  Filled 2017-08-05 (×2): qty 1

## 2017-08-05 MED ORDER — PHENYLEPHRINE 8 MG IN D5W 100 ML (0.08MG/ML) PREMIX OPTIME
INJECTION | INTRAVENOUS | Status: AC
  Filled 2017-08-05: qty 100

## 2017-08-05 MED ORDER — NALBUPHINE HCL 10 MG/ML IJ SOLN: 5.0000 mg | INTRAMUSCULAR | Status: DC | PRN

## 2017-08-05 MED ORDER — NALBUPHINE HCL 10 MG/ML IJ SOLN: 5.0000 mg | Freq: Once | INTRAMUSCULAR | Status: DC | PRN

## 2017-08-05 MED ORDER — SCOPOLAMINE 1 MG/3DAYS TD PT72
MEDICATED_PATCH | TRANSDERMAL | Status: AC
  Filled 2017-08-05: qty 1

## 2017-08-05 MED ORDER — HYDROMORPHONE HCL 1 MG/ML IJ SOLN
0.2500 mg | INTRAMUSCULAR | Status: DC | PRN
  Administered 2017-08-05: 0.5 mg via INTRAVENOUS
  Administered 2017-08-05: 0.25 mg via INTRAVENOUS

## 2017-08-05 MED ORDER — SENNOSIDES-DOCUSATE SODIUM 8.6-50 MG PO TABS
2.0000 | ORAL_TABLET | ORAL | Status: DC
  Administered 2017-08-06 (×2): 2 via ORAL
  Filled 2017-08-05 (×2): qty 2

## 2017-08-05 MED ORDER — GENTAMICIN SULFATE 40 MG/ML IJ SOLN
INTRAVENOUS | Status: DC
  Filled 2017-08-05: qty 8.5

## 2017-08-05 MED ORDER — DIPHENHYDRAMINE HCL 50 MG/ML IJ SOLN
12.5000 mg | INTRAMUSCULAR | Status: DC | PRN
  Administered 2017-08-05: 12.5 mg via INTRAVENOUS
  Filled 2017-08-05: qty 1

## 2017-08-05 MED ORDER — SOD CITRATE-CITRIC ACID 500-334 MG/5ML PO SOLN
ORAL | Status: AC
  Administered 2017-08-05: 30 mL via ORAL
  Filled 2017-08-05: qty 15

## 2017-08-05 MED ORDER — OXYTOCIN 10 UNIT/ML IJ SOLN
INTRAMUSCULAR | Status: DC | PRN
  Administered 2017-08-05: 40 [IU] via INTRAVENOUS

## 2017-08-05 MED ORDER — WITCH HAZEL-GLYCERIN EX PADS: 1.0000 "application " | MEDICATED_PAD | CUTANEOUS | Status: DC | PRN

## 2017-08-05 MED ORDER — OXYCODONE-ACETAMINOPHEN 5-325 MG PO TABS: 1.0000 | ORAL_TABLET | ORAL | Status: DC | PRN

## 2017-08-05 MED ORDER — OXYTOCIN 10 UNIT/ML IJ SOLN
INTRAMUSCULAR | Status: AC
  Filled 2017-08-05: qty 4

## 2017-08-05 MED ORDER — KETOROLAC TROMETHAMINE 30 MG/ML IJ SOLN
30.0000 mg | Freq: Once | INTRAMUSCULAR | Status: AC | PRN
  Administered 2017-08-05: 30 mg via INTRAVENOUS

## 2017-08-05 MED ORDER — KETOROLAC TROMETHAMINE 30 MG/ML IJ SOLN
INTRAMUSCULAR | Status: AC
  Filled 2017-08-05: qty 1

## 2017-08-05 MED ORDER — MEPERIDINE HCL 25 MG/ML IJ SOLN: 6.2500 mg | INTRAMUSCULAR | Status: DC | PRN

## 2017-08-05 MED ORDER — DIBUCAINE 1 % RE OINT: 1.0000 "application " | TOPICAL_OINTMENT | RECTAL | Status: DC | PRN

## 2017-08-05 MED ORDER — FAMOTIDINE IN NACL 20-0.9 MG/50ML-% IV SOLN
20.0000 mg | Freq: Once | INTRAVENOUS | Status: AC
  Administered 2017-08-05: 20 mg via INTRAVENOUS

## 2017-08-05 MED ORDER — ONDANSETRON HCL 4 MG/2ML IJ SOLN
INTRAMUSCULAR | Status: DC | PRN
  Administered 2017-08-05: 4 mg via INTRAVENOUS

## 2017-08-05 MED ORDER — FAMOTIDINE IN NACL 20-0.9 MG/50ML-% IV SOLN
INTRAVENOUS | Status: AC
  Administered 2017-08-05: 20 mg via INTRAVENOUS
  Filled 2017-08-05: qty 50

## 2017-08-05 MED ORDER — ZOLPIDEM TARTRATE 5 MG PO TABS: 5.0000 mg | ORAL_TABLET | Freq: Every evening | ORAL | Status: DC | PRN

## 2017-08-05 MED ORDER — NALOXONE HCL 4 MG/10ML IJ SOLN
1.0000 ug/kg/h | INTRAMUSCULAR | Status: DC | PRN
  Filled 2017-08-05: qty 5

## 2017-08-05 MED ORDER — OXYTOCIN 40 UNITS IN LACTATED RINGERS INFUSION - SIMPLE MED: 2.5000 [IU]/h | INTRAVENOUS | Status: AC

## 2017-08-05 MED ORDER — PRENATAL MULTIVITAMIN CH
1.0000 | ORAL_TABLET | Freq: Every day | ORAL | Status: DC
  Administered 2017-08-06: 1 via ORAL
  Filled 2017-08-05: qty 1

## 2017-08-05 MED ORDER — SODIUM CHLORIDE 0.9 % IR SOLN
Status: DC | PRN
  Administered 2017-08-05: 1

## 2017-08-05 MED ORDER — FENTANYL CITRATE (PF) 100 MCG/2ML IJ SOLN
INTRAMUSCULAR | Status: AC
  Filled 2017-08-05: qty 2

## 2017-08-05 MED ORDER — NALOXONE HCL 0.4 MG/ML IJ SOLN: 0.4000 mg | INTRAMUSCULAR | Status: DC | PRN

## 2017-08-05 MED ORDER — ONDANSETRON HCL 4 MG/2ML IJ SOLN
INTRAMUSCULAR | Status: AC
  Filled 2017-08-05: qty 2

## 2017-08-05 MED ORDER — DIPHENHYDRAMINE HCL 25 MG PO CAPS
25.0000 mg | ORAL_CAPSULE | ORAL | Status: DC | PRN
  Administered 2017-08-05 – 2017-08-06 (×2): 25 mg via ORAL
  Filled 2017-08-05 (×3): qty 1

## 2017-08-05 MED ORDER — COCONUT OIL OIL
1.0000 "application " | TOPICAL_OIL | Status: DC | PRN
  Administered 2017-08-06: 1 via TOPICAL
  Filled 2017-08-05: qty 120

## 2017-08-05 MED ORDER — PROMETHAZINE HCL 25 MG/ML IJ SOLN: 6.2500 mg | INTRAMUSCULAR | Status: DC | PRN

## 2017-08-05 MED ORDER — OXYCODONE-ACETAMINOPHEN 5-325 MG PO TABS: 2.0000 | ORAL_TABLET | ORAL | Status: DC | PRN

## 2017-08-05 MED ORDER — MENTHOL 3 MG MT LOZG: 1.0000 | LOZENGE | OROMUCOSAL | Status: DC | PRN

## 2017-08-05 MED ORDER — ONDANSETRON HCL 40 MG/20ML IJ SOLN
8.0000 mg | Freq: Three times a day (TID) | INTRAMUSCULAR | Status: DC
  Filled 2017-08-05 (×3): qty 4

## 2017-08-05 MED ORDER — PROMETHAZINE HCL 25 MG/ML IJ SOLN
25.0000 mg | Freq: Four times a day (QID) | INTRAMUSCULAR | Status: DC | PRN
  Administered 2017-08-05: 25 mg via INTRAVENOUS
  Filled 2017-08-05: qty 1

## 2017-08-05 MED ORDER — SCOPOLAMINE 1 MG/3DAYS TD PT72: 1.0000 | MEDICATED_PATCH | Freq: Once | TRANSDERMAL | Status: DC

## 2017-08-05 MED ORDER — SOD CITRATE-CITRIC ACID 500-334 MG/5ML PO SOLN
30.0000 mL | Freq: Once | ORAL | Status: AC
  Administered 2017-08-05: 30 mL via ORAL

## 2017-08-05 MED ORDER — OXYCODONE HCL 5 MG PO TABS: 5.0000 mg | ORAL_TABLET | Freq: Once | ORAL | Status: DC | PRN

## 2017-08-05 MED ORDER — DEXAMETHASONE SODIUM PHOSPHATE 4 MG/ML IJ SOLN
INTRAMUSCULAR | Status: DC | PRN
  Administered 2017-08-05: 4 mg via INTRAVENOUS

## 2017-08-05 MED ORDER — SODIUM CHLORIDE 0.9% FLUSH: 3.0000 mL | INTRAVENOUS | Status: DC | PRN

## 2017-08-05 MED ORDER — FENTANYL CITRATE (PF) 100 MCG/2ML IJ SOLN
INTRAMUSCULAR | Status: DC | PRN
  Administered 2017-08-05: 10 ug via INTRATHECAL

## 2017-08-05 SURGICAL SUPPLY — 32 items
BENZOIN TINCTURE PRP APPL 2/3 (GAUZE/BANDAGES/DRESSINGS) ×3 IMPLANT
CHLORAPREP W/TINT 26ML (MISCELLANEOUS) ×3 IMPLANT
CLAMP CORD UMBIL (MISCELLANEOUS) IMPLANT
CLOSURE WOUND 1/2 X4 (GAUZE/BANDAGES/DRESSINGS) ×1
CLOTH BEACON ORANGE TIMEOUT ST (SAFETY) ×3 IMPLANT
DERMABOND ADVANCED (GAUZE/BANDAGES/DRESSINGS)
DERMABOND ADVANCED .7 DNX12 (GAUZE/BANDAGES/DRESSINGS) IMPLANT
DRSG OPSITE POSTOP 4X10 (GAUZE/BANDAGES/DRESSINGS) ×3 IMPLANT
ELECT REM PT RETURN 9FT ADLT (ELECTROSURGICAL) ×3
ELECTRODE REM PT RTRN 9FT ADLT (ELECTROSURGICAL) ×1 IMPLANT
EXTRACTOR VACUUM KIWI (MISCELLANEOUS) IMPLANT
GLOVE BIO SURGEON STRL SZ 6 (GLOVE) ×3 IMPLANT
GLOVE BIOGEL PI IND STRL 6 (GLOVE) ×2 IMPLANT
GLOVE BIOGEL PI IND STRL 7.0 (GLOVE) ×1 IMPLANT
GLOVE BIOGEL PI INDICATOR 6 (GLOVE) ×4
GLOVE BIOGEL PI INDICATOR 7.0 (GLOVE) ×2
GOWN STRL REUS W/TWL LRG LVL3 (GOWN DISPOSABLE) ×6 IMPLANT
KIT ABG SYR 3ML LUER SLIP (SYRINGE) ×3 IMPLANT
NEEDLE HYPO 25X5/8 SAFETYGLIDE (NEEDLE) ×3 IMPLANT
NS IRRIG 1000ML POUR BTL (IV SOLUTION) ×3 IMPLANT
PACK C SECTION WH (CUSTOM PROCEDURE TRAY) ×3 IMPLANT
PAD OB MATERNITY 4.3X12.25 (PERSONAL CARE ITEMS) ×3 IMPLANT
PENCIL SMOKE EVAC W/HOLSTER (ELECTROSURGICAL) ×3 IMPLANT
STRIP CLOSURE SKIN 1/2X4 (GAUZE/BANDAGES/DRESSINGS) ×2 IMPLANT
SUT CHROMIC 0 CTX 36 (SUTURE) ×9 IMPLANT
SUT MON AB 2-0 CT1 27 (SUTURE) ×3 IMPLANT
SUT PDS AB 0 CT1 27 (SUTURE) IMPLANT
SUT PLAIN 0 NONE (SUTURE) IMPLANT
SUT VIC AB 0 CT1 36 (SUTURE) IMPLANT
SUT VIC AB 4-0 KS 27 (SUTURE) IMPLANT
TOWEL OR 17X24 6PK STRL BLUE (TOWEL DISPOSABLE) ×3 IMPLANT
TRAY FOLEY BAG SILVER LF 14FR (SET/KITS/TRAYS/PACK) IMPLANT

## 2017-08-05 NOTE — Anesthesia Preprocedure Evaluation (Addendum)
Anesthesia Evaluation  Patient identified by MRN, date of birth, ID band Patient awake    Reviewed: Allergy & Precautions, H&P , NPO status , Patient's Chart, lab work & pertinent test results  History of Anesthesia Complications Negative for: history of anesthetic complications  Airway Mallampati: III  TM Distance: >3 FB Neck ROM: full    Dental no notable dental hx. (+) Teeth Intact   Pulmonary neg pulmonary ROS,    Pulmonary exam normal breath sounds clear to auscultation       Cardiovascular negative cardio ROS Normal cardiovascular exam Rhythm:regular Rate:Normal     Neuro/Psych negative neurological ROS  negative psych ROS   GI/Hepatic negative GI ROS, Neg liver ROS,   Endo/Other  negative endocrine ROS  Renal/GU negative Renal ROS     Musculoskeletal   Abdominal (+) + obese,   Peds  Hematology negative hematology ROS (+)   Anesthesia Other Findings Previous C-S Frequent contractions  Reproductive/Obstetrics (+) Pregnancy                            Anesthesia Physical Anesthesia Plan  ASA: II and emergent  Anesthesia Plan: Spinal   Post-op Pain Management:    Induction:   PONV Risk Score and Plan: 2 and Ondansetron and Treatment may vary due to age or medical condition  Airway Management Planned: Natural Airway  Additional Equipment:   Intra-op Plan:   Post-operative Plan:   Informed Consent: I have reviewed the patients History and Physical, chart, labs and discussed the procedure including the risks, benefits and alternatives for the proposed anesthesia with the patient or authorized representative who has indicated his/her understanding and acceptance.     Plan Discussed with: CRNA  Anesthesia Plan Comments:         Anesthesia Quick Evaluation

## 2017-08-05 NOTE — Consult Note (Signed)
Neonatology Note:   Attendance at C-section:    I was asked by Dr. Lynnette Caffey to attend this repeat C/S at term, presenting in labor. The mother is a G2P1 B pos, GBS neg with an uncomplicated pregnancy. ROM at delivery, fluid clear. Infant vigorous with good spontaneous cry and tone. Delayed cord clamping was done. Needed bulb suctioning numerous times due to oral secretions. Ap 8/8. Pulse oximeter placed at 5 minutes and saturation was 65% in room air, so BBO2 was given. We performed DeLee suctioning twice and got 10 ml thick, white fluid out. The baby was vigorously crying throughout, but continued to bring up thick mucous into the throat. Lungs clear to ausc in DR, but O2 saturations were not maintained in expected parameters after 25 minutes of working with him, then another 4-5 minutes attempting skin to skin, unless he got some BBO2. I spoke with his parents and we transferred the baby to NICU for transitional care.   Real Cons, MD

## 2017-08-05 NOTE — MAU Note (Signed)
Presents with CTX every 5-7 mins.  No LOF/VB.  Endorses fetal movement.  C section scheduled for tomorrow.  GBS negative

## 2017-08-05 NOTE — Anesthesia Postprocedure Evaluation (Signed)
Anesthesia Post Note  Patient: Ellen Davis  Procedure(s) Performed: CESAREAN SECTION (N/A )     Patient location during evaluation: PACU Anesthesia Type: Spinal Level of consciousness: oriented and awake Pain management: pain level controlled Vital Signs Assessment: post-procedure vital signs reviewed and stable Respiratory status: spontaneous breathing, respiratory function stable and patient connected to nasal cannula oxygen Cardiovascular status: blood pressure returned to baseline and stable Postop Assessment: no headache, no backache, no apparent nausea or vomiting and spinal receding Anesthetic complications: no    Last Vitals:  Vitals:   08/05/17 0715 08/05/17 0731  BP: (!) 121/95 107/63  Pulse: 77 77  Resp: 16 20  Temp:  37.1 C  SpO2: 99% 100%    Last Pain:  Vitals:   08/05/17 0715  PainSc: 6    Pain Goal:                 [name] [name]

## 2017-08-05 NOTE — H&P (Signed)
Ellen Davis is a 37 y.o. female presenting for labor.  CTX started 2 hours ago and have increased in intensity.  She had a previous C/S x 1 and desires repeat.  Last po at 8pm.  No VB or LOF.  Active FM.  Antepartum course uncomplicated. AMA with normal NIPS.  GBS negative.  OB History    Gravida  2   Para  1   Term  1   Preterm      AB      Living  1     SAB      TAB      Ectopic      Multiple  0   Live Births  1          Past Medical History:  Diagnosis Date  . Medical history non-contributory    Past Surgical History:  Procedure Laterality Date  . CESAREAN SECTION N/A 03/22/2015   Procedure: CESAREAN SECTION;  Surgeon: Everlene Farrier, MD;  Location: [facility];  Service: Obstetrics;  Laterality: N/A;  . NO PAST SURGERIES     Family History: family history includes Cancer in her paternal grandmother; Diabetes in her father and mother; Hypertension in her father and mother; Stroke in her mother; Transient ischemic attack in her mother. Social History:  reports that she has never smoked. She has never used smokeless tobacco. She reports that she does not drink alcohol or use drugs.     Maternal Diabetes: No Genetic Screening: Normal Maternal Ultrasounds/Referrals: Normal Fetal Ultrasounds or other Referrals:  None Maternal Substance Abuse:  No Significant Maternal Medications:  None Significant Maternal Lab Results:  Lab values include: Group B Strep negative Other Comments:  None  ROS Maternal Medical History:  Reason for admission: Contractions.   Contractions: Onset was 1-2 hours ago.   Frequency: regular.   Perceived severity is moderate.    Fetal activity: Perceived fetal activity is normal.   Last perceived fetal movement was within the past hour.    Prenatal complications: no prenatal complications Prenatal Complications - Diabetes: none.    Dilation: 3 Effacement (%): 50 Station: -2 Exam by:: Office manager Blood pressure 131/88, pulse 77,  temperature 98.2 F (36.8 C), resp. rate (!) 21, height 5' 4.96" (1.65 m), weight 187 lb (84.8 kg), SpO2 99 %, unknown if currently breastfeeding. Maternal Exam:  Uterine Assessment: Contraction strength is moderate.  Contraction frequency is regular.   Abdomen: Patient reports no abdominal tenderness. Surgical scars: low transverse.   Fundal height is c/w dates.   Estimated fetal weight is 8#.       Physical Exam  Constitutional: She is oriented to person, place, and time. She appears well-developed and well-nourished.  GI: Soft. There is no rebound and no guarding.  Neurological: She is alert and oriented to person, place, and time.  Skin: Skin is warm and dry.  Psychiatric: She has a normal mood and affect. Her behavior is normal.    Prenatal labs: ABO, Rh: B/Positive/-- (09/18 0000) Antibody: Negative (09/18 0000) Rubella: Immune (09/18 0000) RPR: Nonreactive (09/18 0000)  HBsAg: Negative (09/18 0000)  HIV: Non-reactive (09/18 0000)  GBS:     Assessment/Plan: 37yo G2P1001 at [redacted]w[redacted]d with labor; previous C/S x 1 -Patient is offered TOLAC and declines -Counseled re: risk of bleeding, infection, scarring, and damage to surrounding structures.  All questions were answered and the patient wishes to proceed.   Wylie Russon, [place] 08/05/2017, 2:12 AM

## 2017-08-05 NOTE — Lactation Note (Signed)
This note was copied from a baby's chart. Lactation Consultation Note  Patient Name: Ellen Davis [id number] Date: 08/05/2017 Reason for consult: Initial assessment   P2, Baby 15 hours old.   Mother states she had sore nipples with first child but bf for approx 4 mos. Her nipples have been flatter but now are more evert due to latching. Reviewed hand expression before latching. Discussed prepumping w/ manual pump before latch to evert. Assisted w/ latching baby on both breasts. Baby latches with depth guidance. Intermittent swallows observed. Mother concerned about her milk supply and pulls tissue away from infant's nose during feeding. Provided education. Mom encouraged to feed baby 8-12 times/24 hours and with feeding cues.  Mom made aware of O/P services, breastfeeding support groups, community resources, and our phone # for post-discharge questions.     Maternal Data Has patient been taught Hand Expression?: Yes Does the patient have breastfeeding experience prior to this delivery?: Yes  Feeding Feeding Type: Breast Fed  LATCH Score Latch: Repeated attempts needed to sustain latch, nipple held in mouth throughout feeding, stimulation needed to elicit sucking reflex.  Audible Swallowing: A few with stimulation  Type of Nipple: Everted at rest and after stimulation  Comfort (Breast/Nipple): Filling, red/small blisters or bruises, mild/mod discomfort  Hold (Positioning): Assistance needed to correctly position infant at breast and maintain latch.  LATCH Score: 6  Interventions Interventions: Breast feeding basics reviewed;Assisted with latch;Skin to skin;Hand express;Breast compression;Adjust position;Support pillows;Position options;Hand pump  Lactation Tools Discussed/Used     Consult Status Consult Status: Follow-up Date: 08/06/17 Follow-up type: In-patient    Vivianne Master [facility] 08/05/2017, 12:42 PM

## 2017-08-05 NOTE — Op Note (Signed)
Ellen Davis PROCEDURE DATE: 08/05/2017  PREOPERATIVE DIAGNOSIS: Intrauterine pregnancy at  [redacted]w[redacted]d weeks gestation, previous cesarean section with desire for repeat, labor  POSTOPERATIVE DIAGNOSIS: The same  PROCEDURE: Repeat Low Transverse Cesarean Section  SURGEON:  Dr. Linda Hedges  INDICATIONS: Ellen Davis is a 37 y.o. G2P1001 at 107w6d scheduled for cesarean section secondary to previous C/S and labor.  The risks of cesarean section discussed with the patient included but were not limited to: bleeding which may require transfusion or reoperation; infection which may require antibiotics; injury to bowel, bladder, ureters or other surrounding organs; injury to the fetus; need for additional procedures including hysterectomy in the event of a life-threatening hemorrhage; placental abnormalities wth subsequent pregnancies, incisional problems, thromboembolic phenomenon and other postoperative/anesthesia complications. The patient concurred with the proposed plan, giving informed written consent for the procedure.    FINDINGS:  Viable female infant in cephalic presentation, APGARs 8,8:  Weight pending  Clear amniotic fluid.  Intact placenta, three vessel cord.  Grossly normal uterus, ovaries and fallopian tubes. .   ANESTHESIA:  Spinal ESTIMATED BLOOD LOSS: 523 mL ml SPECIMENS: Placenta sent to L&D COMPLICATIONS: None immediate  PROCEDURE IN DETAIL:  The patient received intravenous antibiotics and had sequential compression devices applied to her lower extremities while in the preoperative area.  She was then taken to the operating room where spinal anesthesia was administered and was found to be adequate. She was then placed in a dorsal supine position with a leftward tilt, and prepped and draped in a sterile manner.  A foley catheter was placed into her bladder and attached to constant gravity.  After an adequate timeout was performed, a Pfannenstiel skin incision was made with scalpel and carried  through to the underlying layer of fascia. The fascia was incised in the midline and this incision was extended bilaterally using the Mayo scissors. Kocher clamps were applied to the superior aspect of the fascial incision and the underlying rectus muscles were dissected off bluntly. A similar process was carried out on the inferior aspect of the facial incision. The rectus muscles were separated in the midline bluntly and the peritoneum was entered bluntly.   A transverse hysterotomy was made with a scalpel and extended bilaterally bluntly. The bladder blade was then removed. The infant was successfully delivered, and cord was clamped and cut and infant was handed over to awaiting neonatology team. Uterine massage was then administered and the placenta delivered intact with three-vessel cord. The uterus was cleared of clot and debris.  The hysterotomy was closed with 0 chromic.  A second imbricating suture of 0-chromic was used to reinforce the incision and aid in hemostasis.  The peritoneum and rectus muscles were noted to be hemostatic and were reapproximated using 3-0 monocryl in a running fashion.  The fascia was closed with 0-PDS in a running fashion with good restoration of anatomy.  The subcutaneus tissue was copiously irrigated.  The skin was closed with 4-0 vicryl in a subcuticular fashion.  Pt tolerated the procedure will.  All counts were correct x2.  Pt went to the recovery room in stable condition.

## 2017-08-05 NOTE — Transfer of Care (Signed)
Immediate Anesthesia Transfer of Care Note  Patient: Ellen Davis  Procedure(s) Performed: CESAREAN SECTION (N/A )  Patient Location: PACU  Anesthesia Type:Spinal  Level of Consciousness: awake and alert   Airway & Oxygen Therapy: Patient Spontanous Breathing  Post-op Assessment: Report given to RN and Post -op Vital signs reviewed and stable  Post vital signs: Reviewed and stable  Last Vitals:  Vitals Value Taken Time  BP 92/61 08/05/2017  5:49 AM  Temp    Pulse 78 08/05/2017  5:53 AM  Resp 15 08/05/2017  5:53 AM  SpO2 100 % 08/05/2017  5:53 AM  Vitals shown include unvalidated device data.  Last Pain:  Vitals:   08/05/17 0338  PainSc: 10-Worst pain ever         Complications: No apparent anesthesia complications

## 2017-08-05 NOTE — Anesthesia Procedure Notes (Signed)
Spinal  Patient location during procedure: OR Start time: 08/05/2017 4:20 AM End time: 08/05/2017 4:30 AM Staffing Anesthesiologist: Murvin Natal, MD Performed: anesthesiologist  Preanesthetic Checklist Completed: patient identified, surgical consent, pre-op evaluation, timeout performed, IV checked, risks and benefits discussed and monitors and equipment checked Spinal Block Patient position: sitting Prep: DuraPrep Patient monitoring: cardiac monitor, continuous pulse ox and blood pressure Approach: midline Location: L4-5 Injection technique: single-shot Needle Needle type: Pencan  Needle gauge: 24 G Needle length: 9 cm Assessment Sensory level: T10 Additional Notes Functioning IV was confirmed and monitors were applied. Sterile prep and drape, including hand hygiene and sterile gloves were used. The patient was positioned and the spine was prepped. The skin was anesthetized with lidocaine.  Free flow of clear CSF was obtained prior to injecting local anesthetic into the CSF.  The spinal needle aspirated freely following injection.  The needle was carefully withdrawn.  The patient tolerated the procedure well.

## 2017-08-06 ENCOUNTER — Inpatient Hospital Stay (HOSPITAL_COMMUNITY): Admission: AD | Admit: 2017-08-06 | Payer: 59 | Source: Ambulatory Visit | Admitting: Obstetrics and Gynecology

## 2017-08-06 LAB — CBC
HEMATOCRIT: 31.4 % — AB (ref 36.0–46.0)
Hemoglobin: 10.2 g/dL — ABNORMAL LOW (ref 12.0–15.0)
MCH: 27.7 pg (ref 26.0–34.0)
MCHC: 32.5 g/dL (ref 30.0–36.0)
MCV: 85.3 fL (ref 78.0–100.0)
Platelets: 210 10*3/uL (ref 150–400)
RBC: 3.68 MIL/uL — ABNORMAL LOW (ref 3.87–5.11)
RDW: 17 % — AB (ref 11.5–15.5)
WBC: 11.6 10*3/uL — AB (ref 4.0–10.5)

## 2017-08-06 NOTE — Lactation Note (Signed)
This note was copied from a baby's chart. Lactation Consultation Note  Patient Name: Ellen Davis [id number] Date: 08/06/2017 Reason for consult: Follow-up assessment;Nipple pain/trauma Mom called out for feeding assist.  She attempted to latch baby using cradle hold with too little support under baby.  Added another pillow and had mom use cross cradle hold.  Baby opened wide and latched easily with good depth.  Mom comfortable after initial latch on pain.  Baby needed stimulation to feed well.  Mom praised for her efforts.  Encouraged to call out for assist/concerns.  Maternal Data    Feeding Feeding Type: Breast Fed Nipple Type: Slow - flow  LATCH Score Latch: Grasps breast easily, tongue down, lips flanged, rhythmical sucking.  Audible Swallowing: Spontaneous and intermittent  Type of Nipple: Everted at rest and after stimulation  Comfort (Breast/Nipple): Filling, red/small blisters or bruises, mild/mod discomfort  Hold (Positioning): Assistance needed to correctly position infant at breast and maintain latch.  LATCH Score: 8  Interventions Interventions: Assisted with latch;Breast compression;Skin to skin;Adjust position;Breast massage;Support pillows;Hand express  Lactation Tools Discussed/Used     Consult Status Consult Status: Follow-up Date: 08/07/17 Follow-up type: In-patient    Ave Filter 08/06/2017, 2:27 PM

## 2017-08-06 NOTE — Lactation Note (Signed)
This note was copied from a baby's chart. Lactation Consultation Note  Patient Name: Ellen Davis [id number] Date: 08/06/2017  [name] is 69 hours old.  Mom c/o cracked nipples.  She states it is hard for baby to open wide.  Both nipples cracked on tip of nipples.  Comfort gels given with instructions.  Baby is currently sleeping in crib.  Instructed to call for latch assist when baby starts to cue.   Maternal Data Formula Feeding for Exclusion: No Has patient been taught Hand Expression?: Yes Does the patient have breastfeeding experience prior to this delivery?: Yes  Feeding Feeding Type: Bottle Fed - Formula Nipple Type: Slow - flow Length of feed: 15 min  LATCH Score Latch: Repeated attempts needed to sustain latch, nipple held in mouth throughout feeding, stimulation needed to elicit sucking reflex.  Audible Swallowing: A few with stimulation  Type of Nipple: Everted at rest and after stimulation  Comfort (Breast/Nipple): Filling, red/small blisters or bruises, mild/mod discomfort  Hold (Positioning): No assistance needed to correctly position infant at breast.  LATCH Score: 7  Interventions Interventions: Assisted with latch;Skin to skin;Hand express;Support pillows;Coconut oil  Lactation Tools Discussed/Used     Consult Status      Ave Filter 08/06/2017, 10:54 AM

## 2017-08-06 NOTE — Progress Notes (Signed)
Subjective: Postpartum Day 1: Cesarean Delivery Patient reports tolerating PO.    Objective: Vital signs in last 24 hours: Temp:  [97.9 F (36.6 C)-98.9 F (37.2 C)] 97.9 F (36.6 C) (04/11 0826) Pulse Rate:  [63-91] 91 (04/11 0826) Resp:  [16-20] 20 (04/11 0826) BP: (85-122)/(46-103) 102/66 (04/11 0826) SpO2:  [95 %-98 %] 97 % (04/11 0826)  Physical Exam:  General: alert Lochia: appropriate Uterine Fundus: firm Incision: healing well DVT Evaluation: No evidence of DVT seen on physical exam.  Recent Labs    08/05/17 0212 08/06/17 0636  HGB 12.4 10.2*  HCT 37.1 31.4*    Assessment/Plan: Status post Cesarean section. Doing well postoperatively.  Continue current care.  [place] 08/06/2017, 9:12 AM

## 2017-08-07 ENCOUNTER — Encounter (HOSPITAL_COMMUNITY): Payer: Self-pay | Admitting: Obstetrics & Gynecology

## 2017-08-07 MED ORDER — IBUPROFEN 600 MG PO TABS: 600.0000 mg | ORAL_TABLET | Freq: Four times a day (QID) | ORAL | 0 refills | Status: AC

## 2017-08-07 MED ORDER — DOCUSATE SODIUM 100 MG PO CAPS: 100.0000 mg | ORAL_CAPSULE | Freq: Two times a day (BID) | ORAL | 2 refills | Status: AC

## 2017-08-07 MED ORDER — OXYCODONE-ACETAMINOPHEN 5-325 MG PO TABS: 1.0000 | ORAL_TABLET | ORAL | 0 refills | Status: AC | PRN

## 2017-08-07 NOTE — Lactation Note (Signed)
This note was copied from a baby's chart. Lactation Consultation Note  Patient Name: Ellen Davis [id number] Date: 08/07/2017 Reason for consult: Follow-up assessment   Baby 11 hours old.  Mother states her nipples are sore so she primarily bottle fed formula last night. Baby cueing.  Mother willing to try breastfeeding. Baby did not initially latch so LC allowed baby to suck on finger. Then relatched baby on L side. Encouraged breast compression. Mom encouraged to feed baby 8-12 times/24 hours and with feeding cues.  Reviewed engorgement care and monitoring voids/stools. Suggest breastfeeding before offering formula to help establish milk supply.    Maternal Data    Feeding Feeding Type: Breast Fed Nipple Type: Slow - flow Length of feed: 10 min  LATCH Score Latch: Repeated attempts needed to sustain latch, nipple held in mouth throughout feeding, stimulation needed to elicit sucking reflex.  Audible Swallowing: A few with stimulation  Type of Nipple: Everted at rest and after stimulation  Comfort (Breast/Nipple): Filling, red/small blisters or bruises, mild/mod discomfort  Hold (Positioning): No assistance needed to correctly position infant at breast.  LATCH Score: 7  Interventions Interventions: Breast feeding basics reviewed;Coconut oil;Comfort gels  Lactation Tools Discussed/Used     Consult Status Consult Status: Complete    Carlye Grippe 08/07/2017, 9:34 AM

## 2017-08-07 NOTE — Discharge Summary (Signed)
Obstetric Discharge Summary Reason for Admission: onset of labor and cesarean section Prenatal Procedures: none Intrapartum Procedures: cesarean: low cervical, transverse Postpartum Procedures: none Complications-Operative and Postpartum: none Hemoglobin  Date Value Ref Range Status  08/06/2017 10.2 (L) 12.0 - 15.0 g/dL Final   HCT  Date Value Ref Range Status  08/06/2017 31.4 (L) 36.0 - 46.0 % Final    Physical Exam:  General: alert, cooperative and appears stated age 24: appropriate Uterine Fundus: firm Incision: healing well, no significant drainage, no dehiscence, no significant erythema DVT Evaluation: No evidence of DVT seen on physical exam. Negative Homan's sign. No cords or calf tenderness. No significant calf/ankle edema.  Discharge Diagnoses: Term Pregnancy-delivered  Discharge Information: Date: 08/07/2017 Activity: pelvic rest Diet: routine Medications: Ibuprofen, Colace and Percocet Condition: stable Instructions: refer to practice specific booklet Discharge to: home   Newborn Data: Live born female  Birth Weight: 8 lb 9.2 oz (3890 g) APGAR: 8, 8  Newborn Delivery   Birth date/time:  08/05/2017 04:55:00 Delivery type:  C-Section, Low Transverse Trial of labor:  No C-section categorization:  Repeat     Home with mother.  Tyson Dense 08/07/2017, 8:41 AM

## 2018-05-25 ENCOUNTER — Other Ambulatory Visit: Payer: Self-pay | Admitting: Obstetrics and Gynecology

## 2018-05-25 DIAGNOSIS — N6002 Solitary cyst of left breast: Secondary | ICD-10-CM

## 2018-05-31 ENCOUNTER — Ambulatory Visit
Admission: RE | Admit: 2018-05-31 | Discharge: 2018-05-31 | Disposition: A | Discharge status: Home / Self Care | Payer: 59 | Source: Ambulatory Visit | Attending: Obstetrics and Gynecology | Admitting: Obstetrics and Gynecology

## 2018-05-31 ENCOUNTER — Other Ambulatory Visit: Payer: Self-pay | Admitting: Obstetrics and Gynecology

## 2018-05-31 DIAGNOSIS — N6002 Solitary cyst of left breast: Secondary | ICD-10-CM

## 2018-05-31 DIAGNOSIS — R2232 Localized swelling, mass and lump, left upper limb: Secondary | ICD-10-CM

## 2020-01-30 IMAGING — US US AXILLARY LEFT
1 series · 13 of 13 positions shown · non-contrast
Comparison: None.

CLINICAL DATA: 37-year-old female, breast feeding for the past 9
months, has developed worsening firmness and pain in the left
axilla. The patient states this area it use to previously becoming
engorged but would improve after nursing. Since her symptoms began,
the associated symptoms no longer improve with nursing.

EXAM:
DIGITAL DIAGNOSTIC BILATERAL MAMMOGRAM WITH CAD AND TOMO
ULTRASOUND LEFT AXILLA

[Series 1: us axillary left · 0.07mm/px · 13 of 13 slices shown]
[im 1/13]
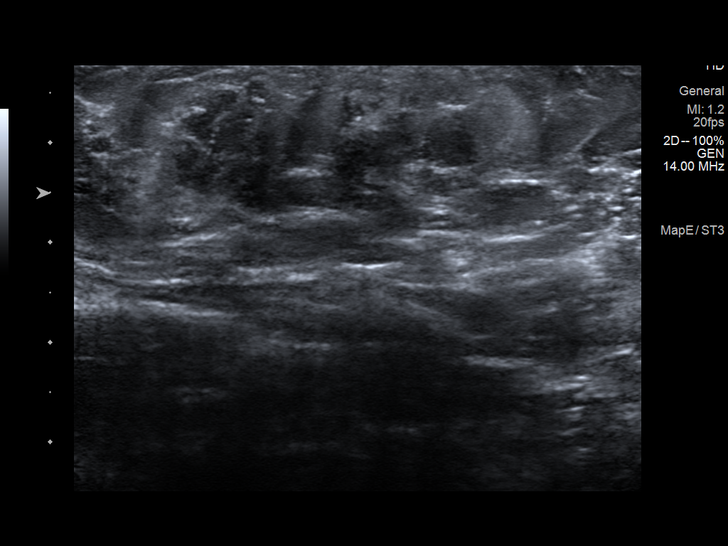
[im 2/13]
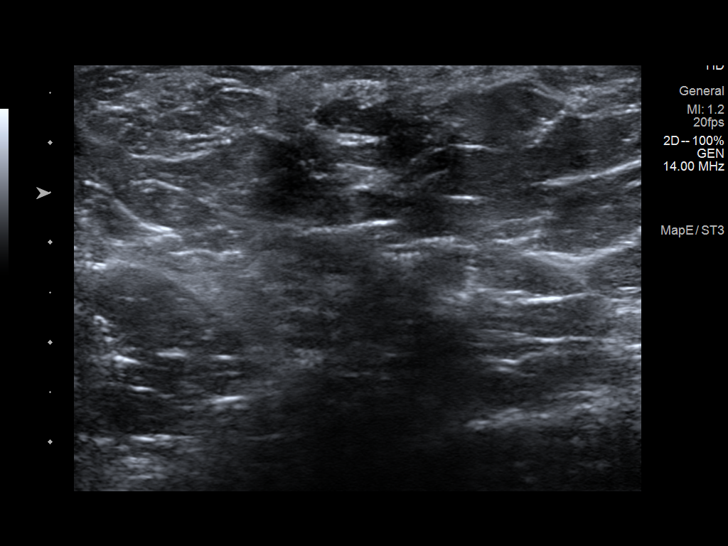
[im 3/13]
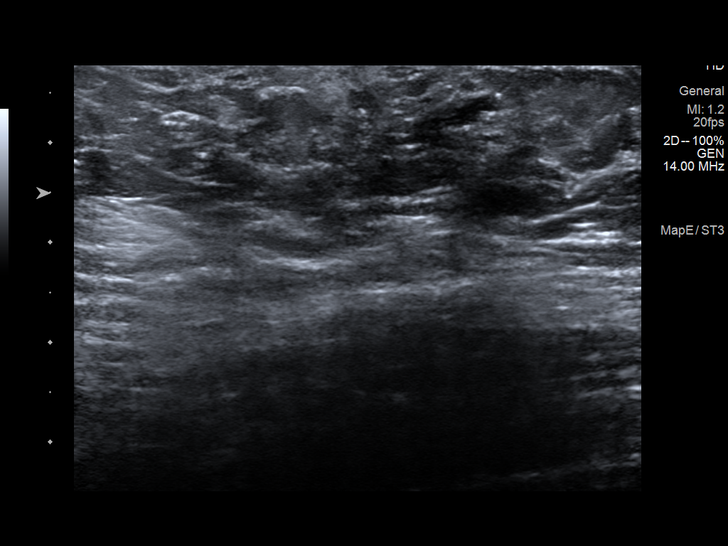
[im 4/13]
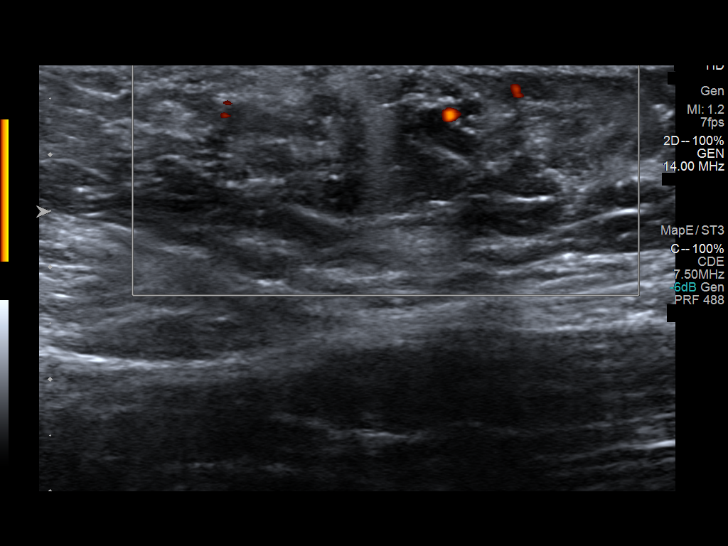
[im 5/13]
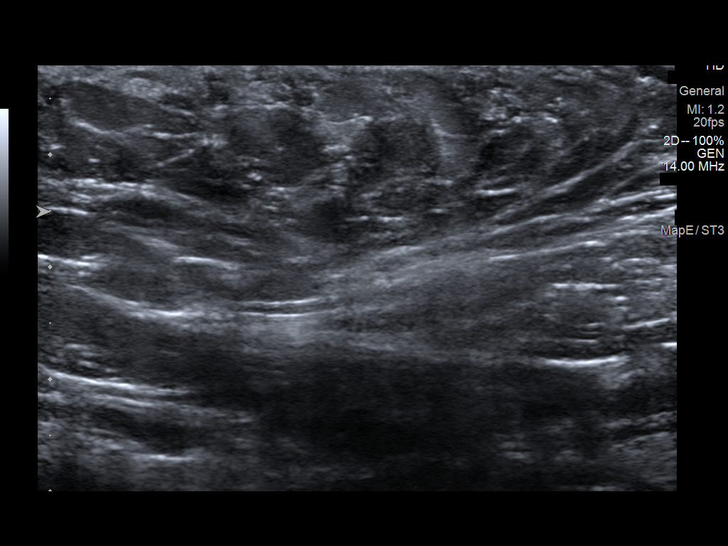
[im 6/13]
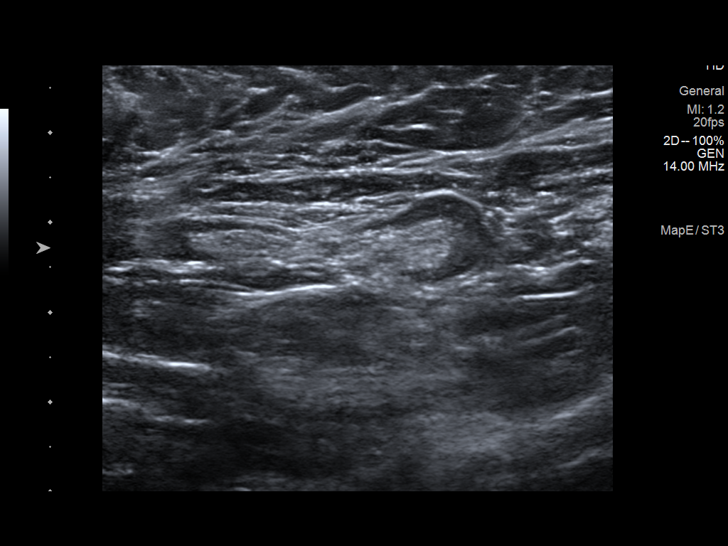
[im 7/13]
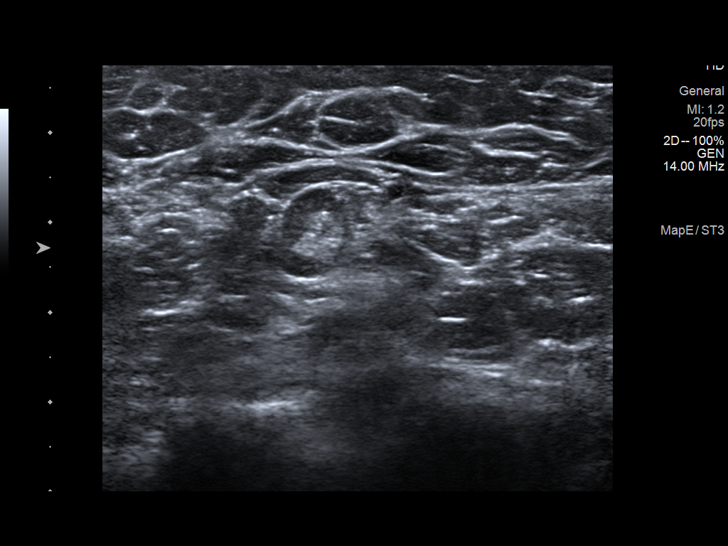
[im 8/13]
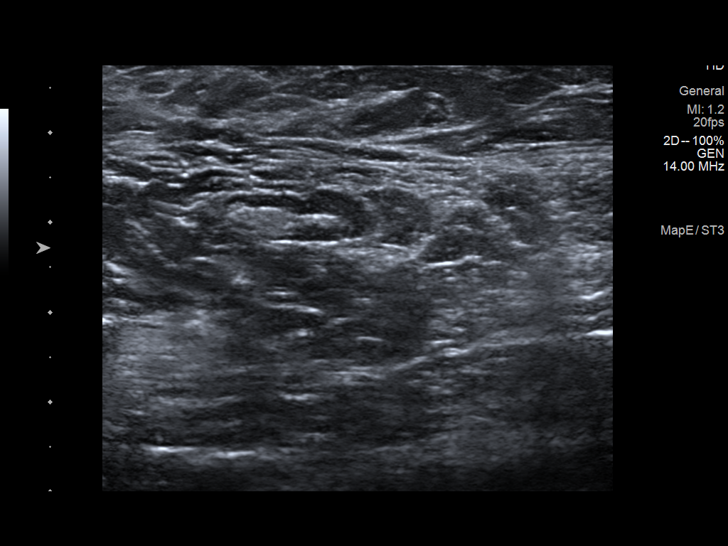
[im 9/13]
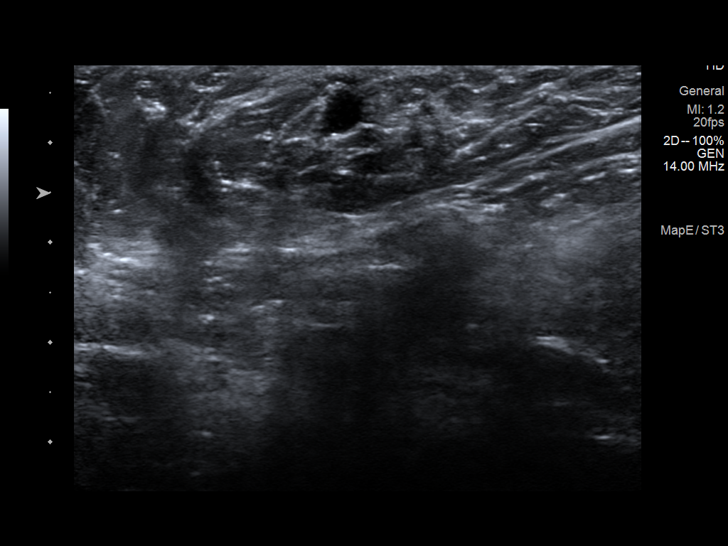
[im 10/13]
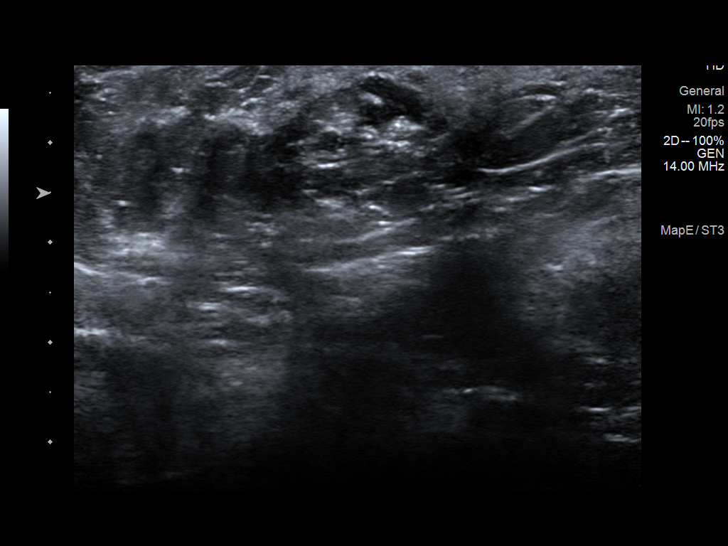
[im 11/13]
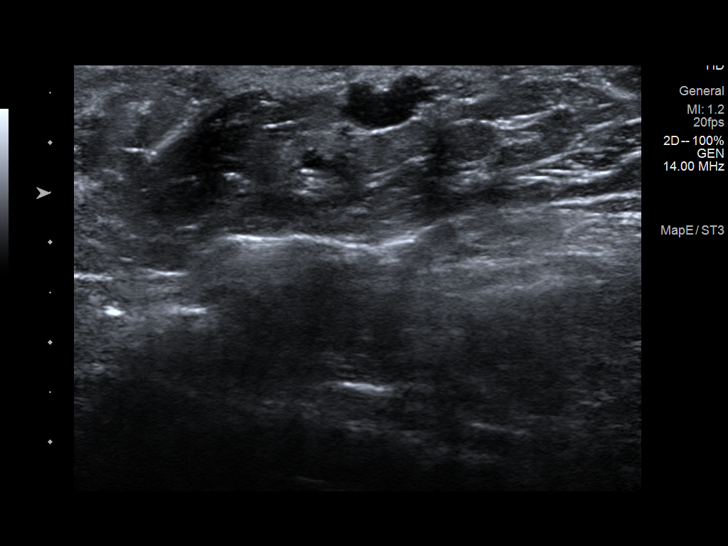
[im 12/13]
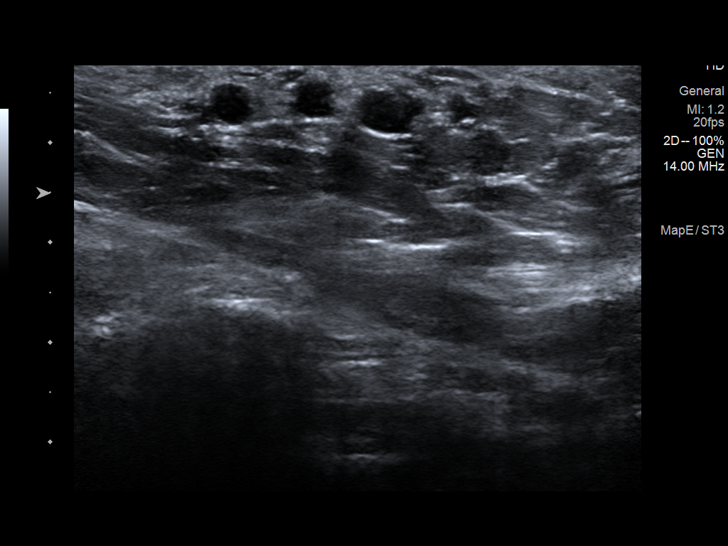
[im 13/13]
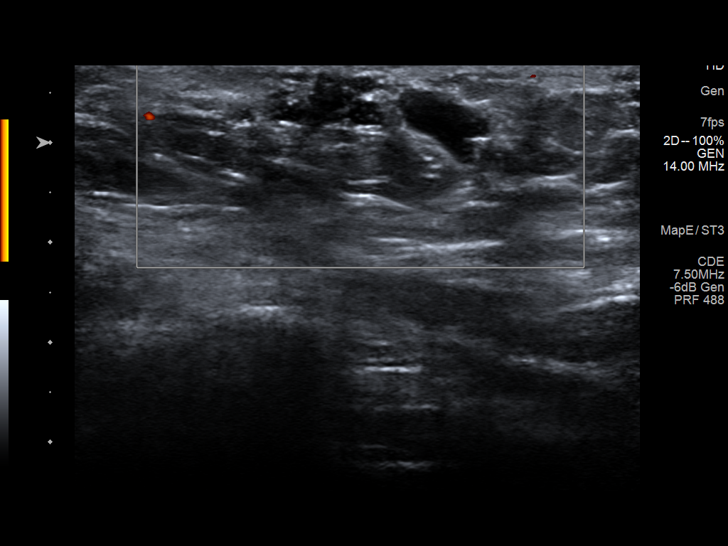

[13 of 13 positions shown; findings below may reference images not displayed]

ACR Breast Density Category d: The breast tissue is extremely dense,
which lowers the sensitivity of mammography.
FINDINGS: No suspicious mammographic findings are identified in either breast.
A radiopaque BB is placed at the site of the patient's focal
symptoms in the left axilla. Asymmetric density with interspersed
fat is seen deep to the radiopaque BB. The appearance is suggestive
of a axillary glandular tissue. Further evaluation with ultrasound
was performed.

Mammographic images were processed with CAD.

On physical exam, a large, diffuse lump in the left axilla
demonstrates associated tenderness and firmness. A small pea size
fixed lump is palpated. There are no overlying skin changes to
suggest infection.

Targeted ultrasound is performed, showing areas of focal
fibroglandular tissue with a few minimally dilated duct like
structures. No specific sonographic abnormality is seen in
association with a small, palpable pea-sized fixed lump.
IMPRESSION: 1. Findings consistent with symptomatic glandular tissue involving
the left axilla. A small fixed palpable lump is suggestive of a
blocked duct, with a few minimally dilated ducts are seen
sonographically. No suspicious mammographic or sonographic findings
are identified. There are no clinical features to suggest infection
at this time.
2. No mammographic evidence of malignancy in either breast.

RECOMMENDATION:
1. Recommend clinical and symptomatic follow-up for the patient's
left axillary symptoms. The patient was encouraged to apply
compresses and massage this area, as well as over-the-counter
medications for pain relief. She was encouraged to follow-up with
her referring position and consider referral for a lactation
consultant.
2. Screening mammogram at age 42 unless there are persistent or
intervening clinical concerns. (Code:Q9-8-RWV)

I have discussed the findings and recommendations with the patient.
Results were also provided in writing at the conclusion of the
visit. If applicable, a reminder letter will be sent to the patient
regarding the next appointment.

BI-RADS CATEGORY  2: Benign.

## 2020-01-30 IMAGING — MG DIGITAL DIAGNOSTIC BILATERAL MAMMOGRAM WITH TOMO AND CAD
6 of 10 series · 6 of 30 positions shown · non-contrast
Comparison: None.

CLINICAL DATA: 37-year-old female, breast feeding for the past 9
months, has developed worsening firmness and pain in the left
axilla. The patient states this area it use to previously becoming
engorged but would improve after nursing. Since her symptoms began,
the associated symptoms no longer improve with nursing.

EXAM:
DIGITAL DIAGNOSTIC BILATERAL MAMMOGRAM WITH CAD AND TOMO
ULTRASOUND LEFT AXILLA

[R CC synth-2D]
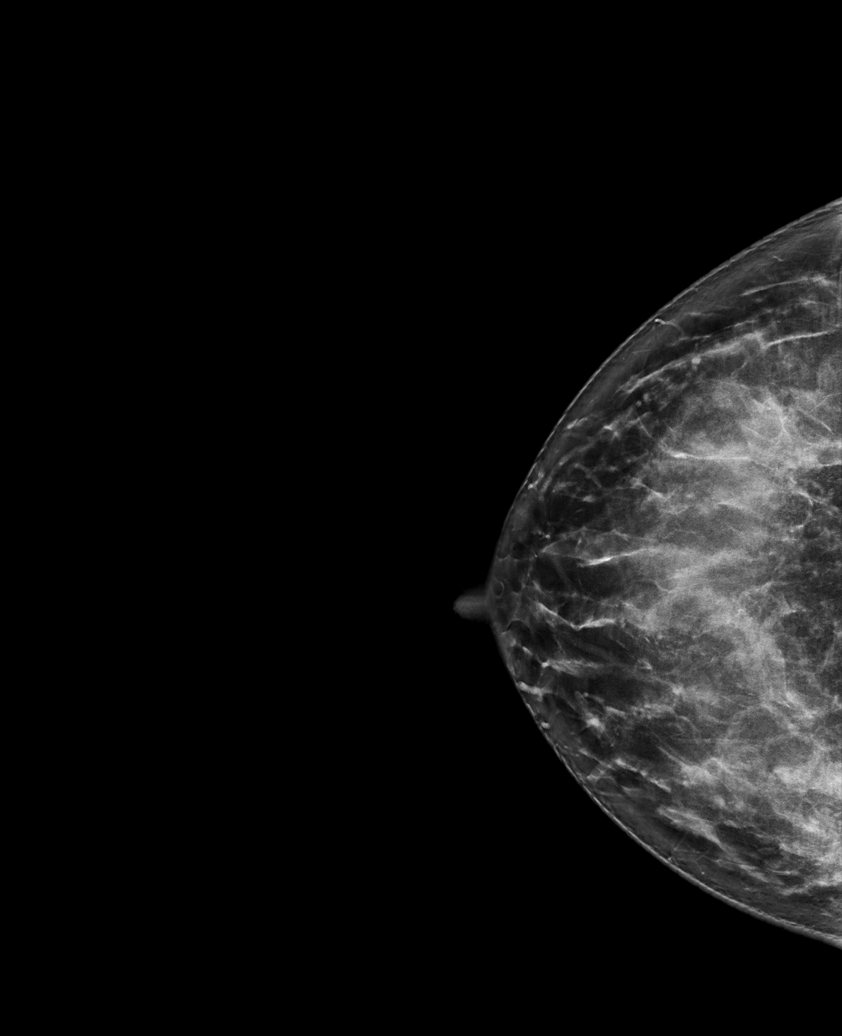

[R MLO synth-2D]
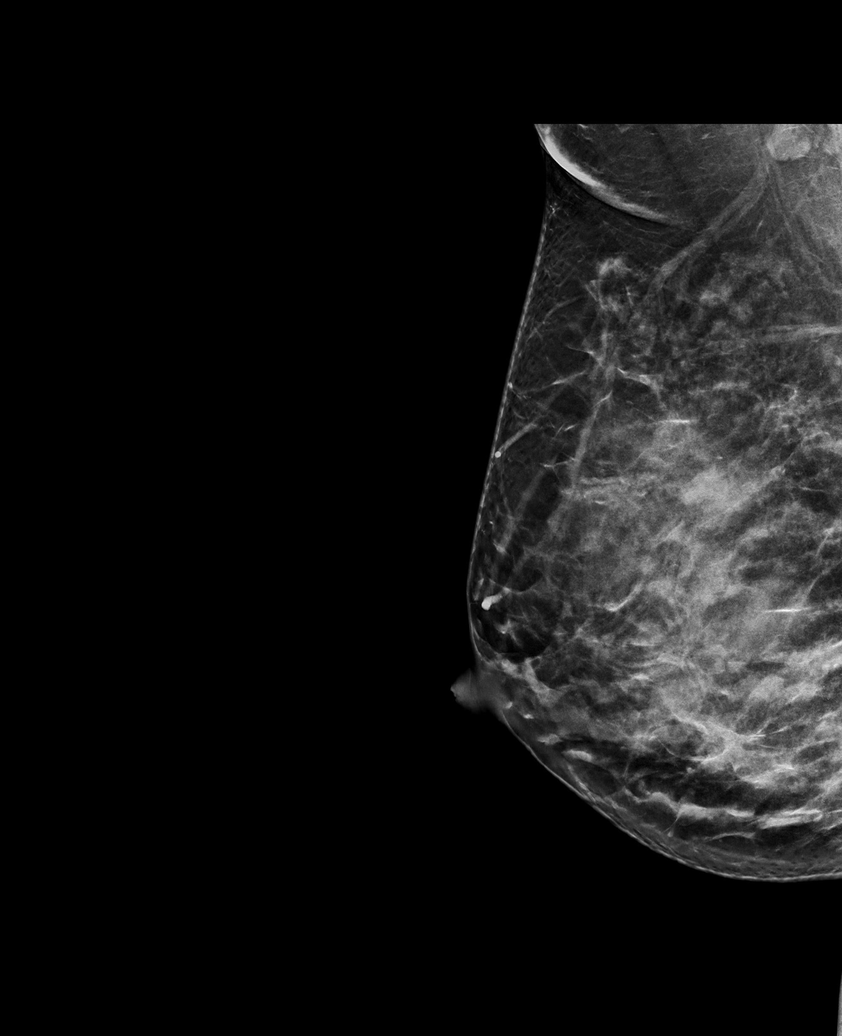

[L CC synth-2D]
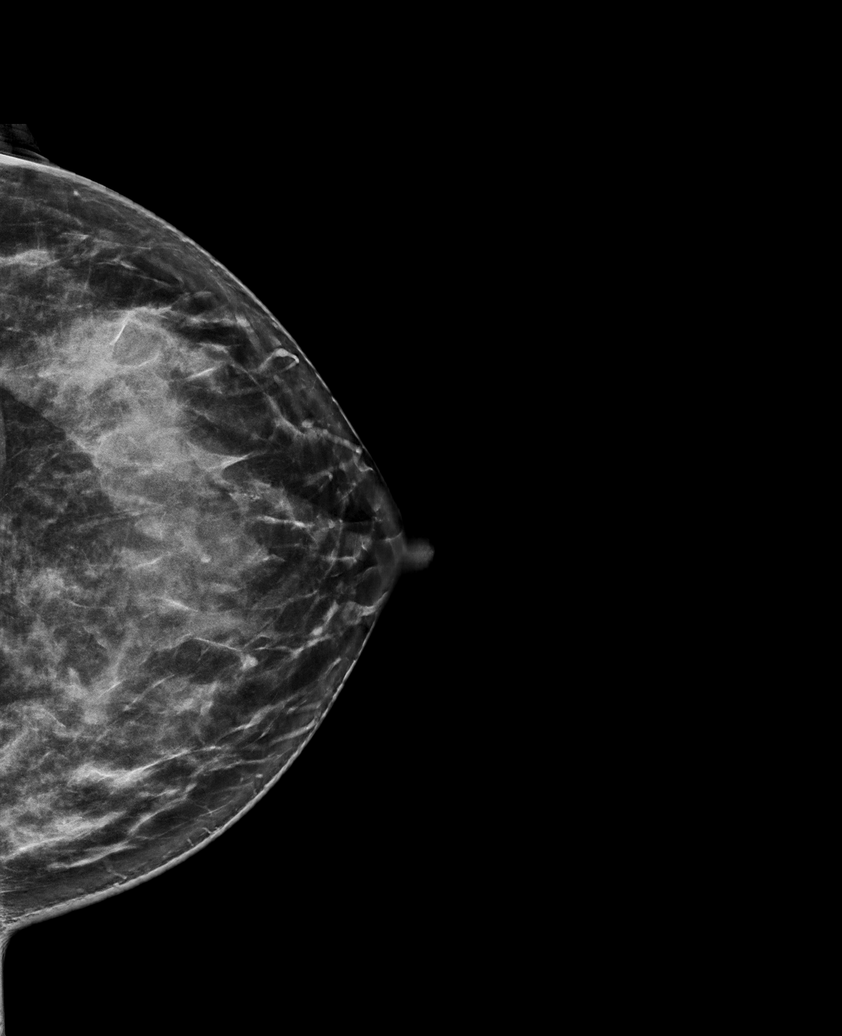

[L MLO synth-2D (1 of 2)]
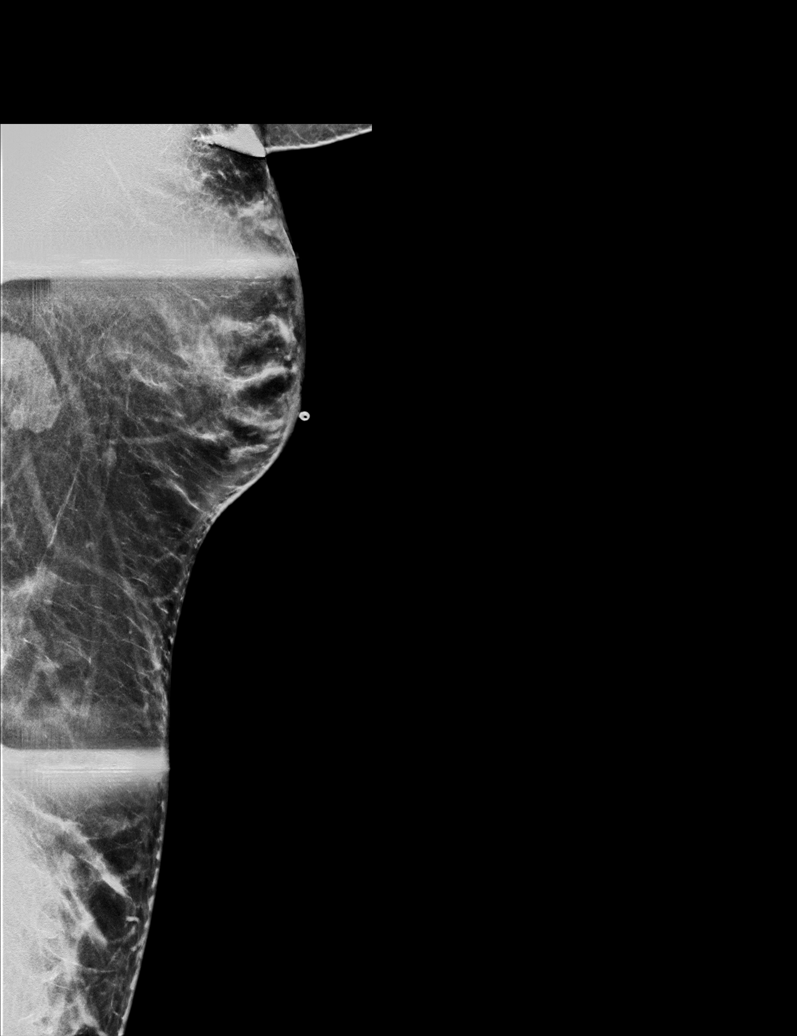

[L MLO synth-2D (2 of 2)]
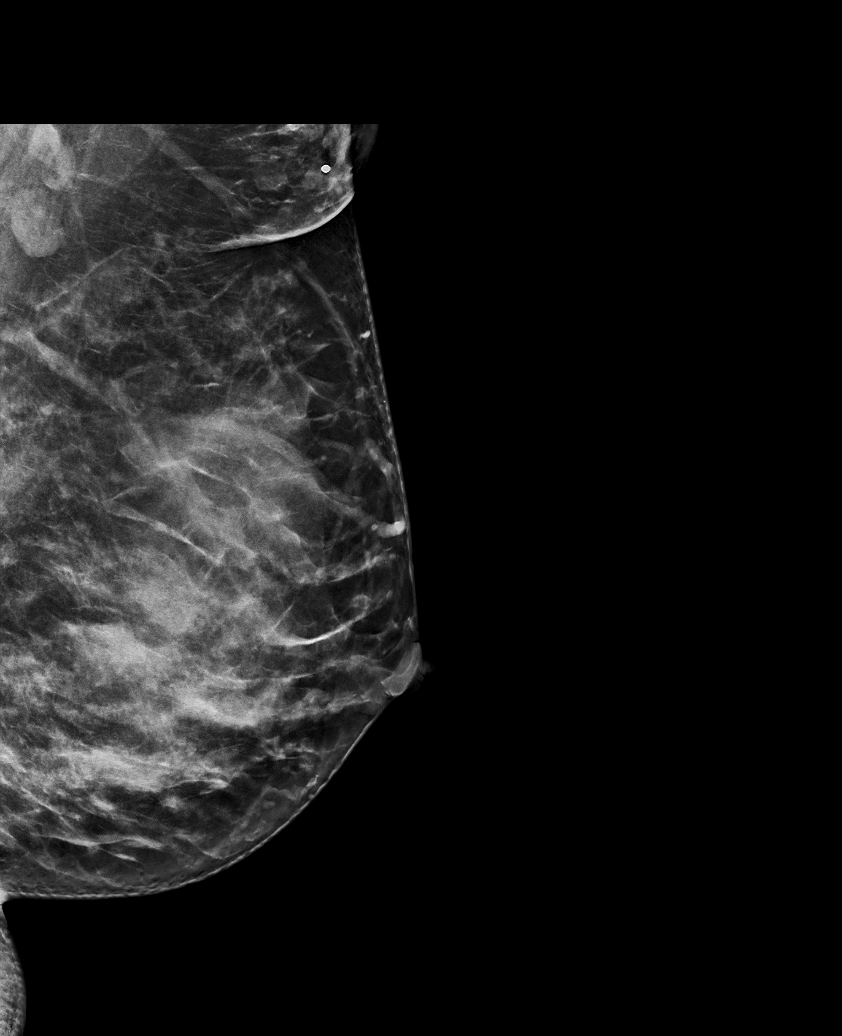

[L MLO tomo · tomo slice 41/80.0]
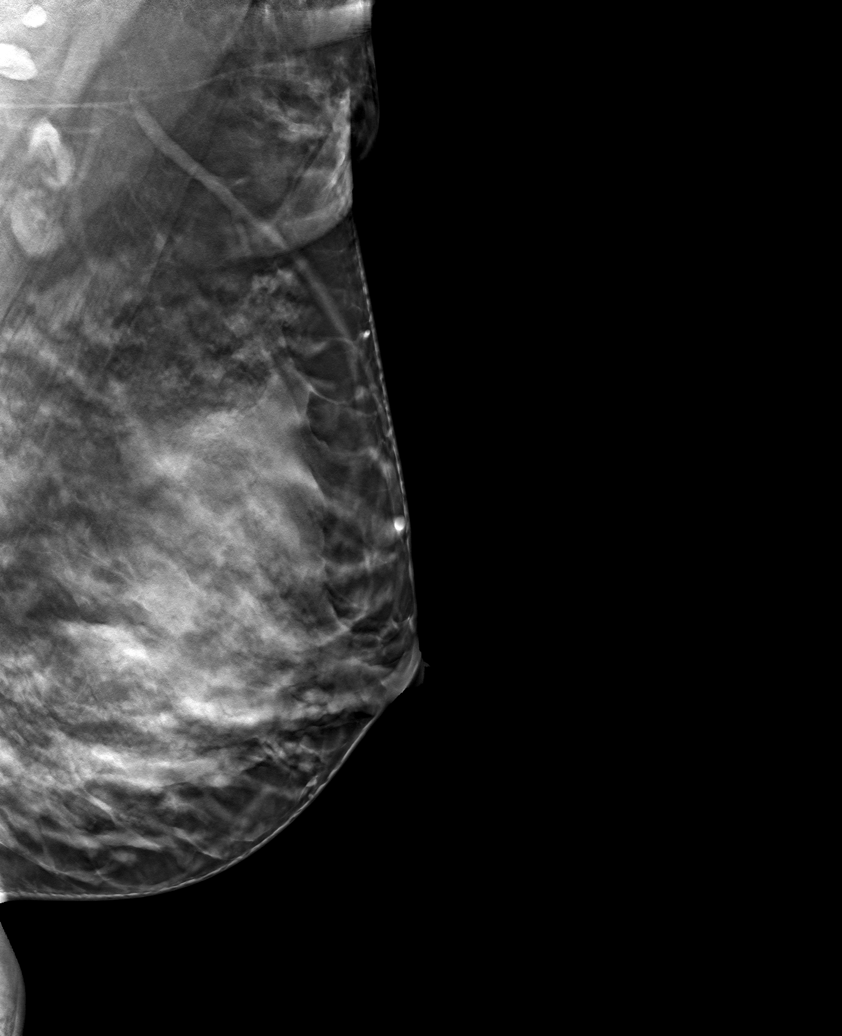

[6 of 30 positions shown; findings below may reference images not displayed]

ACR Breast Density Category d: The breast tissue is extremely dense,
which lowers the sensitivity of mammography.
FINDINGS: No suspicious mammographic findings are identified in either breast.
A radiopaque BB is placed at the site of the patient's focal
symptoms in the left axilla. Asymmetric density with interspersed
fat is seen deep to the radiopaque BB. The appearance is suggestive
of a axillary glandular tissue. Further evaluation with ultrasound
was performed.

Mammographic images were processed with CAD.

On physical exam, a large, diffuse lump in the left axilla
demonstrates associated tenderness and firmness. A small pea size
fixed lump is palpated. There are no overlying skin changes to
suggest infection.

Targeted ultrasound is performed, showing areas of focal
fibroglandular tissue with a few minimally dilated duct like
structures. No specific sonographic abnormality is seen in
association with a small, palpable pea-sized fixed lump.
IMPRESSION: 1. Findings consistent with symptomatic glandular tissue involving
the left axilla. A small fixed palpable lump is suggestive of a
blocked duct, with a few minimally dilated ducts are seen
sonographically. No suspicious mammographic or sonographic findings
are identified. There are no clinical features to suggest infection
at this time.
2. No mammographic evidence of malignancy in either breast.

RECOMMENDATION:
1. Recommend clinical and symptomatic follow-up for the patient's
left axillary symptoms. The patient was encouraged to apply
compresses and massage this area, as well as over-the-counter
medications for pain relief. She was encouraged to follow-up with
her referring position and consider referral for a lactation
consultant.
2. Screening mammogram at age 42 unless there are persistent or
intervening clinical concerns. (Code:Q9-8-RWV)

I have discussed the findings and recommendations with the patient.
Results were also provided in writing at the conclusion of the
visit. If applicable, a reminder letter will be sent to the patient
regarding the next appointment.

BI-RADS CATEGORY  2: Benign.
